# Patient Record
Sex: Female | Born: 1985 | Race: Black or African American | Hispanic: No | Smoking: Former smoker
Health system: Southern US, Community
[De-identification: ages and names within clinical notes are randomized; demographics above are authoritative.]

## PROBLEM LIST (undated history)

## (undated) DIAGNOSIS — Z789 Other specified health status: Secondary | ICD-10-CM

## (undated) HISTORY — PX: NO PAST SURGERIES: SHX2092

---

## 2009-05-03 ENCOUNTER — Emergency Department (HOSPITAL_COMMUNITY): Admission: EM | Admit: 2009-05-03 | Discharge: 2009-05-03 | Payer: Self-pay | Admitting: Emergency Medicine

## 2009-11-19 ENCOUNTER — Ambulatory Visit: Payer: Self-pay | Admitting: Diagnostic Radiology

## 2009-11-19 ENCOUNTER — Emergency Department (HOSPITAL_BASED_OUTPATIENT_CLINIC_OR_DEPARTMENT_OTHER): Admission: EM | Admit: 2009-11-19 | Discharge: 2009-11-19 | Payer: Self-pay | Admitting: Emergency Medicine

## 2010-09-30 LAB — PREGNANCY, URINE: Preg Test, Ur: NEGATIVE

## 2012-12-28 ENCOUNTER — Emergency Department (HOSPITAL_COMMUNITY)
Admission: EM | Admit: 2012-12-28 | Discharge: 2012-12-28 | Disposition: A | Discharge status: Nursing Home (Includes ICF) | Payer: BC Managed Care – PPO | Source: Home / Self Care

## 2012-12-28 ENCOUNTER — Encounter (HOSPITAL_COMMUNITY): Payer: Self-pay | Admitting: *Deleted

## 2012-12-28 ENCOUNTER — Emergency Department (HOSPITAL_COMMUNITY)
Admission: EM | Admit: 2012-12-28 | Discharge: 2012-12-29 | Disposition: A | Discharge status: Home / Self Care | Payer: BC Managed Care – PPO | Attending: Emergency Medicine | Admitting: Emergency Medicine

## 2012-12-28 ENCOUNTER — Encounter (HOSPITAL_COMMUNITY): Payer: Self-pay | Admitting: Emergency Medicine

## 2012-12-28 DIAGNOSIS — K5900 Constipation, unspecified: Secondary | ICD-10-CM | POA: Insufficient documentation

## 2012-12-28 DIAGNOSIS — F17200 Nicotine dependence, unspecified, uncomplicated: Secondary | ICD-10-CM | POA: Insufficient documentation

## 2012-12-28 DIAGNOSIS — R110 Nausea: Secondary | ICD-10-CM | POA: Insufficient documentation

## 2012-12-28 DIAGNOSIS — K219 Gastro-esophageal reflux disease without esophagitis: Secondary | ICD-10-CM

## 2012-12-28 DIAGNOSIS — Z79899 Other long term (current) drug therapy: Secondary | ICD-10-CM | POA: Insufficient documentation

## 2012-12-28 DIAGNOSIS — R1084 Generalized abdominal pain: Secondary | ICD-10-CM | POA: Insufficient documentation

## 2012-12-28 DIAGNOSIS — R109 Unspecified abdominal pain: Secondary | ICD-10-CM

## 2012-12-28 LAB — CBC WITH DIFFERENTIAL/PLATELET
Basophils Absolute: 0 10*3/uL (ref 0.0–0.1)
Eosinophils Relative: 1 % (ref 0–5)
Hemoglobin: 13.4 g/dL (ref 12.0–15.0)
MCHC: 33.5 g/dL (ref 30.0–36.0)
MCV: 84.2 fL (ref 78.0–100.0)
Monocytes Absolute: 0.9 10*3/uL (ref 0.1–1.0)
Neutrophils Relative %: 70 % (ref 43–77)
Platelets: 371 10*3/uL (ref 150–400)
RBC: 4.75 MIL/uL (ref 3.87–5.11)
WBC: 12.5 10*3/uL — ABNORMAL HIGH (ref 4.0–10.5)

## 2012-12-28 LAB — POCT I-STAT, CHEM 8
BUN: 10 mg/dL (ref 6–23)
Calcium, Ion: 1.25 mmol/L — ABNORMAL HIGH (ref 1.12–1.23)
Chloride: 105 mEq/L (ref 96–112)
HCT: 46 % (ref 36.0–46.0)
Hemoglobin: 15.6 g/dL — ABNORMAL HIGH (ref 12.0–15.0)
Potassium: 4 mEq/L (ref 3.5–5.1)
Sodium: 143 mEq/L (ref 135–145)

## 2012-12-28 LAB — COMPREHENSIVE METABOLIC PANEL
BUN: 11 mg/dL (ref 6–23)
CO2: 26 mEq/L (ref 19–32)
Creatinine, Ser: 0.81 mg/dL (ref 0.50–1.10)
GFR calc non Af Amer: >90 mL/min (ref >90)
Potassium: 4.6 mEq/L (ref 3.5–5.1)
Sodium: 139 mEq/L (ref 135–145)

## 2012-12-28 LAB — POCT URINALYSIS DIP (DEVICE)
Ketones, ur: NEGATIVE mg/dL
Leukocytes, UA: NEGATIVE
Protein, ur: NEGATIVE mg/dL
Specific Gravity, Urine: 1.025 (ref 1.005–1.030)

## 2012-12-28 LAB — POCT H PYLORI SCREEN: H. PYLORI SCREEN, POC: NEGATIVE

## 2012-12-28 MED ORDER — IOHEXOL 300 MG/ML  SOLN
20.0000 mL | INTRAMUSCULAR | Status: DC
  Administered 2012-12-28: 50 mL via ORAL

## 2012-12-28 MED ORDER — ONDANSETRON HCL 4 MG/2ML IJ SOLN
4.0000 mg | Freq: Once | INTRAMUSCULAR | Status: AC
  Administered 2012-12-28: 4 mg via INTRAVENOUS
  Filled 2012-12-28: qty 2

## 2012-12-28 MED ORDER — PANTOPRAZOLE SODIUM 40 MG PO TBEC: 40.0000 mg | DELAYED_RELEASE_TABLET | Freq: Every day | ORAL | Status: DC

## 2012-12-28 MED ORDER — GI COCKTAIL ~~LOC~~
30.0000 mL | Freq: Once | ORAL | Status: AC
  Administered 2012-12-28: 30 mL via ORAL
  Filled 2012-12-28: qty 30

## 2012-12-28 MED ORDER — MORPHINE SULFATE 4 MG/ML IJ SOLN
4.0000 mg | Freq: Once | INTRAMUSCULAR | Status: AC
  Administered 2012-12-28: 4 mg via INTRAVENOUS
  Filled 2012-12-28: qty 1

## 2012-12-28 NOTE — ED Provider Notes (Signed)
History    CSN: [id number] Arrival date & time 12/28/12  1944  First MD Initiated Contact with Patient 12/28/12 2205     Chief Complaint  Patient presents with  . Abdominal Pain   (Consider location/radiation/quality/duration/timing/severity/associated sxs/prior Treatment) HPI Comments: Pt w/ no PMHx now w/ 3 wk hx of abd pain and GERD. States daily intermittent diffuse abd pain - sharp, a/w nausea, no vomiting. Denies diarrhea/constipation, dysuria/polyuria/hematuria. LMP 2 months ago, no vaginal bleeding/discharge/irritation. Pain not exacerbated w/ PO intake. Also admits to GERD - substernal burning pain worse in AM. A/w PO intake and position changes. No fever, sick contact, travel or hospitalizations. Seen at urgent care today, hcg neg, u/a neg, mild leukocytosis - sent to ED for further eval.   Patient is a 27 y.o. female presenting with general illness. The history is provided by the patient. No language interpreter was used.  Illness Location:  GI Quality:  Abd pain Severity:  Mild Onset quality:  Gradual Duration:  3 weeks Timing:  Intermittent Progression:  Worsening Chronicity:  Recurrent Associated symptoms: abdominal pain and nausea   Associated symptoms: no chest pain, no congestion, no cough, no diarrhea, no fever, no headaches, no rash, no shortness of breath, no sore throat and no vomiting    No past medical history on file. No past surgical history on file. No family history on file. History  Substance Use Topics  . Smoking status: Current Every Day Smoker  . Smokeless tobacco: Not on file  . Alcohol Use: Yes   OB History   Grav Para Term Preterm Abortions TAB SAB Ect Mult Living                 Review of Systems  Constitutional: Negative for fever and chills.  HENT: Negative for congestion and sore throat.   Respiratory: Negative for cough and shortness of breath.   Cardiovascular: Negative for chest pain and leg swelling.  Gastrointestinal: Positive  for nausea and abdominal pain. Negative for vomiting, diarrhea and constipation.  Genitourinary: Negative for dysuria and frequency.  Skin: Negative for color change and rash.  Neurological: Negative for dizziness and headaches.  Psychiatric/Behavioral: Negative for confusion and agitation.  All other systems reviewed and are negative.    Allergies  Review of patient's allergies indicates no known allergies.  Home Medications   Current Outpatient Rx  Name  Route  Sig  Dispense  Refill  . drospirenone-ethinyl estradiol (YAZ,GIANVI,LORYNA) 3-0.02 MG tablet   Oral   Take 1 tablet by mouth daily.         . pantoprazole (PROTONIX) 40 MG tablet   Oral   Take 1 tablet (40 mg total) by mouth daily.   30 tablet   0    BP 120/59  Pulse 66  Temp(Src) 98.3 F (36.8 C) (Oral)  Resp 16  SpO2 100%  LMP 10/31/2012 Physical Exam  Vitals reviewed. Constitutional: She is oriented to person, place, and time. She appears well-developed and well-nourished. No distress.  HENT:  Head: Normocephalic and atraumatic.  Eyes: EOM are normal. Pupils are equal, round, and reactive to light.  Neck: Normal range of motion. Neck supple.  Cardiovascular: Normal rate and regular rhythm.   Pulmonary/Chest: Effort normal. No respiratory distress.  Abdominal: Soft. She exhibits no distension. There is no hepatosplenomegaly. There is tenderness in the left lower quadrant. There is rebound. There is no rigidity, no guarding, no CVA tenderness, no tenderness at McBurney's point and negative Murphy's sign.  Musculoskeletal: Normal range of motion. She exhibits no edema.  Neurological: She is alert and oriented to person, place, and time.  Skin: Skin is warm and dry.  Psychiatric: She has a normal mood and affect. Her behavior is normal.    ED Course  Procedures (including critical care time) Labs Reviewed  LIPASE, BLOOD  COMPREHENSIVE METABOLIC PANEL   Results for orders placed during the hospital  encounter of 12/28/12  LIPASE, BLOOD      Result Value Range   Lipase 42  11 - 59 U/L  COMPREHENSIVE METABOLIC PANEL      Result Value Range   Sodium 139  135 - 145 mEq/L   Potassium 4.6  3.5 - 5.1 mEq/L   Chloride 105  96 - 112 mEq/L   CO2 26  19 - 32 mEq/L   Glucose, Bld 101 (*) 70 - 99 mg/dL   BUN 11  6 - 23 mg/dL   Creatinine, Ser 1.61  0.50 - 1.10 mg/dL   Calcium 9.5  8.4 - 09.6 mg/dL   Total Protein 8.1  6.0 - 8.3 g/dL   Albumin 3.9  3.5 - 5.2 g/dL   AST 17  0 - 37 U/L   ALT 15  0 - 35 U/L   Alkaline Phosphatase 80  39 - 117 U/L   Total Bilirubin 0.2 (*) 0.3 - 1.2 mg/dL   GFR calc non Af Amer >90  >90 mL/min   GFR calc Af Amer >90  >90 mL/min    CT Abdomen Pelvis W Contrast (Final result)  Result time: 12/29/12 01:33:26    Final result by Rad Results In Interface (12/29/12 01:33:26)    Narrative:   *RADIOLOGY REPORT*  Clinical Data: Abdominal pain.  CT ABDOMEN AND PELVIS WITH CONTRAST  Technique: Multidetector CT imaging of the abdomen and pelvis was performed following the standard protocol during bolus administration of intravenous contrast.  Contrast: OMNIPAQUE IOHEXOL 300 MG/ML SOLN  Comparison: None.  Findings: Lung Bases: Clear. Respiratory motion at the superior margin.  Liver: Normal.  Spleen: Normal.  Gallbladder: Normal.  Common bile duct: Normal.  Pancreas: Normal.  Adrenal glands: Normal.  Kidneys: Distended with contrast. No inflammatory changes.  Stomach: Normal.  Small bowel: No obstruction or inflammatory changes. No mesenteric adenopathy. Fecalization of loops in the anatomic pelvis leading up to the ileocecal valve without an obstruction point.  Colon: Normal appendix. Prominent stool burden. No inflammatory changes. Rectosigmoid appears within normal limits.  Pelvic Genitourinary: Fibroid uterus. Urinary bladder decompressed. No free fluid. Physiologic appearance of the ovaries.  Bones: No aggressive osseous  lesions.  Vasculature: Normal.  Body Wall: Normal.  IMPRESSION: No acute abnormality. Fecalization of distal small bowel without obstruction suggesting obstipation in the appropriate setting.   Original Report Authenticated By: Andreas Newport, M.D.         No results found. No diagnosis found.  MDM  Exam significant for LLQ pain w/ rebound. Likely benign, possible diverticulitis, colitis. Doubt ovarian cyst or torsion or TOA. Doubt appendicitis or perforated viscus. H. pylori at UC neg. Likely GERD, possible gastritis. Will obtain CT, lipase and and CMP, will give GI cocktail, morphine and zofran.   Course: vitals stable, NAD, c/w c/w constipation - no obstruction. Labs unremarkable, lipase normal. Stable for d/c home. Given rx for miralax and colace. Encourage increased fiber and hydration. Given rx for protonix. fup w/ pcp as needed. D/c in good condition. Given return precautions.   1. Abdominal  pain, other specified  site   2. GERD (gastroesophageal reflux disease)   3. Constipation    New Prescriptions   DOCUSATE SODIUM (COLACE) 100 MG CAPSULE    Take 1 capsule (100 mg total) by mouth every 12 (twelve) hours.   PANTOPRAZOLE (PROTONIX) 20 MG TABLET    Take 1 tablet (20 mg total) by mouth daily.   POLYETHYLENE GLYCOL (MIRALAX / GLYCOLAX) PACKET    Take 17 g by mouth daily.      follow up with your primary care as needed    Audelia Hives, MD 12/29/12 [phone]

## 2012-12-28 NOTE — ED Provider Notes (Signed)
27 year old female was sent here from urgent care to get a CT scan. She's been having upper abdominal complaints which seem consistent with GERD and that has been going on for several weeks. Over the last several days, she has had pain across both lower quadrants without radiation. Pain is moderate and she rates it at 6/10. She's not had any constipation or diarrhea and has not had any fever or chills. She denies any urinary symptoms. She went to urgent care were WBC was noted to be elevated to 12.5 and she was sent here to get CT scan. On exam, she has significant left lower quadrant tenderness with rebound tenderness. There is no guarding. Bowel sounds are present. CT scan has been ordered.  I saw and evaluated the patient, reviewed the resident's note and I agree with the findings and plan.   Dione Booze, MD 12/28/12 2257

## 2012-12-28 NOTE — ED Notes (Signed)
Lower abd. Pain: 2-3 weeks. Nausea, h/a, fatigue.  Also, c/o acid reflux. At urgent care, WBC elevated to 12.5. No urinary problems.

## 2012-12-28 NOTE — ED Notes (Signed)
Pt reports intermittent right and lower abdominal pain, cramping type in nature associated with nausea x approx 2 weeks. Report no menstrual cycle in 1 month. Pt reports intermittent generalized headache and just not feeling right.

## 2012-12-28 NOTE — ED Notes (Signed)
Pt c/o poss preg... LMP on 10/31/12... 3 home preg tests were all neg.. sxs today include lower abd pain, HA, fatigue... Denies: f/v/d, urinary sxs, gyn sxs... Also c/o acid reflux... Taking tums w/no relief... She is alert w/no signs of acute distress.

## 2012-12-29 ENCOUNTER — Emergency Department (HOSPITAL_COMMUNITY): Payer: BC Managed Care – PPO

## 2012-12-29 MED ORDER — IOHEXOL 300 MG/ML  SOLN
100.0000 mL | Freq: Once | INTRAMUSCULAR | Status: AC | PRN
  Administered 2012-12-29: 100 mL via INTRAVENOUS

## 2012-12-29 MED ORDER — POLYETHYLENE GLYCOL 3350 17 G PO PACK: 17.0000 g | PACK | Freq: Every day | ORAL | Status: DC

## 2012-12-29 MED ORDER — DOCUSATE SODIUM 100 MG PO CAPS: 100.0000 mg | ORAL_CAPSULE | Freq: Two times a day (BID) | ORAL | Status: DC

## 2012-12-29 MED ORDER — PANTOPRAZOLE SODIUM 20 MG PO TBEC: 20.0000 mg | DELAYED_RELEASE_TABLET | Freq: Every day | ORAL | Status: DC

## 2012-12-29 NOTE — ED Provider Notes (Signed)
History    CSN: [id number] Arrival date & time 12/28/12  1602  None    Chief Complaint  Patient presents with  . Possible Pregnancy   (Consider location/radiation/quality/duration/timing/severity/associated sxs/prior Treatment) HPI  27 yo bf presented with acid reflux, abd pain, fatigue.  Symptoms onging for several days.  Has taken otc antacid for reflux without relief.  Reflux symptoms with lying down and after eating.  Denies dysuria, hematuria , diarrhea.  Some constipation.   History reviewed. No pertinent past medical history. History reviewed. No pertinent past surgical history. No family history on file. History  Substance Use Topics  . Smoking status: Current Every Day Smoker  . Smokeless tobacco: Not on file  . Alcohol Use: Yes   OB History   Grav Para Term Preterm Abortions TAB SAB Ect Mult Living                 Review of Systems  Constitutional: Negative for fever, chills, diaphoresis and fatigue.  Eyes: Negative.   Respiratory: Negative.   Cardiovascular: Negative.   Gastrointestinal: Positive for abdominal pain and constipation. Negative for nausea, vomiting and rectal pain.  Endocrine: Negative.   Genitourinary: Negative for dysuria, hematuria, flank pain, vaginal discharge and difficulty urinating.  Musculoskeletal: Negative.   Skin: Negative.   Psychiatric/Behavioral: Negative.     Allergies  Review of patient's allergies indicates no known allergies.  Home Medications   Current Outpatient Rx  Name  Route  Sig  Dispense  Refill  . drospirenone-ethinyl estradiol (YAZ,GIANVI,LORYNA) 3-0.02 MG tablet   Oral   Take 1 tablet by mouth daily.         Marland Kitchen docusate sodium (COLACE) 100 MG capsule   Oral   Take 1 capsule (100 mg total) by mouth every 12 (twelve) hours.   60 capsule   0   . pantoprazole (PROTONIX) 20 MG tablet   Oral   Take 1 tablet (20 mg total) by mouth daily.   60 tablet   0   . pantoprazole (PROTONIX) 40 MG tablet   Oral  Take 1 tablet (40 mg total) by mouth daily.   30 tablet   0   . polyethylene glycol (MIRALAX / GLYCOLAX) packet   Oral   Take 17 g by mouth daily.   14 each   0    BP 106/71  Pulse 66  Temp(Src) 97.3 F (36.3 C) (Oral)  Resp 16  SpO2 100%  LMP 10/31/2012 Physical Exam  Constitutional: She is oriented to person, place, and time. She appears well-developed and well-nourished.  HENT:  Head: Normocephalic and atraumatic.  Eyes: EOM are normal. Pupils are equal, round, and reactive to light.  Neck: Normal range of motion.  Cardiovascular: Normal rate and regular rhythm.   Pulmonary/Chest: Effort normal and breath sounds normal.  Abdominal: Soft. Bowel sounds are normal. There is tenderness. There is guarding.  Musculoskeletal: Normal range of motion.  Neurological: She is alert and oriented to person, place, and time.  Skin: Skin is warm and dry.  Psychiatric: She has a normal mood and affect.    ED Course  Procedures (including critical care time) Labs Reviewed  CBC WITH DIFFERENTIAL - Abnormal; Notable for the following:    WBC 12.5 (*)    Neutro Abs 8.7 (*)    All other components within normal limits  POCT I-STAT, CHEM 8 - Abnormal; Notable for the following:    Calcium, Ion 1.25 (*)    Hemoglobin 15.6 (*)  All other components within normal limits  POCT PREGNANCY, URINE  POCT URINALYSIS DIP (DEVICE)  POCT H PYLORI SCREEN   Ct Abdomen Pelvis W Contrast  12/29/2012   *RADIOLOGY REPORT*  Clinical Data: Abdominal pain.  CT ABDOMEN AND PELVIS WITH CONTRAST  Technique:  Multidetector CT imaging of the abdomen and pelvis was performed following the standard protocol during bolus administration of intravenous contrast.  Contrast: OMNIPAQUE IOHEXOL 300 MG/ML  SOLN  Comparison: None.  Findings: Lung Bases: Clear.  Respiratory motion at the superior margin.  Liver:  Normal.  Spleen:  Normal.  Gallbladder:  Normal.  Common bile duct:  Normal.  Pancreas:  Normal.  Adrenal  glands:  Normal.  Kidneys:  Distended with contrast.  No inflammatory changes.  Stomach:  Normal.  Small bowel:  No obstruction or inflammatory changes.  No mesenteric adenopathy.  Fecalization of loops in the anatomic pelvis leading up to the ileocecal valve without an obstruction point.  Colon:   Normal appendix.  Prominent stool burden.  No inflammatory changes.  Rectosigmoid appears within normal limits.  Pelvic Genitourinary:  Fibroid uterus.  Urinary bladder decompressed.  No free fluid.  Physiologic appearance of the ovaries.  Bones:  No aggressive osseous lesions.  Vasculature: Normal.  Body Wall: Normal.  IMPRESSION: No acute abnormality.  Fecalization of distal small bowel without obstruction suggesting obstipation in the appropriate setting.   Original Report Authenticated By: Andreas Newport, M.D.   1. GERD (gastroesophageal reflux disease)     MDM  With her worsening symptoms and increased wbc, we will send her down to ED for furter evaluation.  Patient voices understanding.   Zonia Kief, PA-C 12/29/12 2040

## 2012-12-29 NOTE — ED Notes (Signed)
Pt tolerated drinking contrast with no difficulty. CT called.

## 2012-12-30 NOTE — ED Provider Notes (Signed)
Medical screening examination/treatment/procedure(s) were performed by non-physician practitioner and as supervising physician I was immediately available for consultation/collaboration.  Leslee Home, M.D.  Reuben Likes, MD 12/30/12 [phone]

## 2014-09-21 ENCOUNTER — Emergency Department (HOSPITAL_COMMUNITY)
Admission: EM | Admit: 2014-09-21 | Discharge: 2014-09-21 | Disposition: A | Discharge status: Home / Self Care | Payer: 59 | Source: Home / Self Care | Attending: Family Medicine | Admitting: Family Medicine

## 2014-09-21 ENCOUNTER — Emergency Department (INDEPENDENT_AMBULATORY_CARE_PROVIDER_SITE_OTHER): Payer: 59

## 2014-09-21 ENCOUNTER — Encounter (HOSPITAL_COMMUNITY): Payer: Self-pay | Admitting: *Deleted

## 2014-09-21 DIAGNOSIS — R0789 Other chest pain: Secondary | ICD-10-CM

## 2014-09-21 MED ORDER — DICLOFENAC POTASSIUM 50 MG PO TABS: 50.0000 mg | ORAL_TABLET | Freq: Three times a day (TID) | ORAL | Status: DC

## 2014-09-21 NOTE — ED Notes (Signed)
Per pt request RX called to [facility] on [address].  # [id number].  Rx diclofenac 50mg  1 tab 3 x daily for chest pain #21 with no refills.  Mw,cma

## 2014-09-21 NOTE — ED Provider Notes (Signed)
CSN: [id number]     Arrival date & time 09/21/14  0900 History   First MD Initiated Contact with Patient 09/21/14 [phone]     Chief Complaint  Patient presents with  . Chest Pain   (Consider location/radiation/quality/duration/timing/severity/associated sxs/prior Treatment) Patient is a 29 y.o. female presenting with chest pain. The history is provided by the patient.  Chest Pain Pain location:  L lateral chest Pain quality: sharp   Pain radiates to:  Does not radiate Pain radiates to the back: no   Pain severity:  Mild Onset quality:  Gradual Duration:  3 days Chronicity:  New Context: movement   Relieved by:  None tried Worsened by:  Nothing tried Ineffective treatments:  None tried Associated symptoms: no cough, no fever, no lower extremity edema, no nausea, no palpitations and no shortness of breath     History reviewed. No pertinent past medical history. History reviewed. No pertinent past surgical history. No family history on file. History  Substance Use Topics  . Smoking status: Current Every Day Smoker  . Smokeless tobacco: Not on file  . Alcohol Use: Yes   OB History    No data available     Review of Systems  Constitutional: Negative.  Negative for fever.  Respiratory: Negative for cough and shortness of breath.   Cardiovascular: Positive for chest pain. Negative for palpitations.  Gastrointestinal: Negative for nausea.    Allergies  Review of patient's allergies indicates no known allergies.  Home Medications   Prior to Admission medications   Medication Sig Start Date End Date Taking? Authorizing Provider  diclofenac (CATAFLAM) 50 MG tablet Take 1 tablet (50 mg total) by mouth 3 (three) times daily. For chest pain 09/21/14   Billy Fischer, MD  docusate sodium (COLACE) 100 MG capsule Take 1 capsule (100 mg total) by mouth every 12 (twelve) hours. 12/29/12   Ernst Spell, MD  drospirenone-ethinyl estradiol Sherrill Raring) 3-0.02 MG tablet Take 1  tablet by mouth daily.    Historical Provider, MD  pantoprazole (PROTONIX) 20 MG tablet Take 1 tablet (20 mg total) by mouth daily. 12/29/12   Ernst Spell, MD  pantoprazole (PROTONIX) 40 MG tablet Take 1 tablet (40 mg total) by mouth daily. 12/28/12   Lanae Crumbly, PA-C  polyethylene glycol [facility] / GLYCOLAX) packet Take 17 g by mouth daily. 12/29/12   Ernst Spell, MD   BP 120/62 mmHg  Pulse 74  Temp(Src) 98.7 F (37.1 C) (Oral)  Resp 12  SpO2 100%  LMP 09/08/2014 Physical Exam  Constitutional: She appears well-developed and well-nourished. No distress.  HENT:  Mouth/Throat: Oropharynx is clear and moist.  Eyes: Pupils are equal, round, and reactive to light.  Neck: Normal range of motion. Neck supple.  Cardiovascular: Normal rate, regular rhythm, normal heart sounds and intact distal pulses.   Pulmonary/Chest: Effort normal and breath sounds normal. She exhibits no tenderness.  Abdominal: Soft. Bowel sounds are normal. There is no tenderness.  Skin: Skin is warm and dry.  Nursing note and vitals reviewed.   ED Course  Procedures (including critical care time) Labs Review Labs Reviewed - No data to display  Imaging Review Dg Chest 2 View  09/21/2014   CLINICAL DATA:  Three-day history of left-sided chest pain  EXAM: CHEST  2 VIEW  COMPARISON:  Nov 19, 2009  FINDINGS: Lungs are clear. The heart size and pulmonary vascularity are normal. No pneumothorax. No adenopathy. No bone lesions.  IMPRESSION: No abnormality noted.   Electronically Signed  By: Lowella Grip III M.D.   On: 09/21/2014 09:51   X-rays reviewed and report per radiologist.    Pauline Good MD.    MDM      Billy Fischer, MD 09/21/14 1014

## 2014-09-21 NOTE — Discharge Instructions (Signed)
Use medicine and heat as needed, activity as tolerated, return as needed.

## 2014-09-21 NOTE — ED Notes (Signed)
Pt  Reports  l upper sided    Chest  Pain  Radiating  Around  To her l  Shoulder        -  Hurts   When  She  Takes  A  Deep breath   Or  Laughs      -    She   sdenys  Any  specefic  Injury  She  Is sitting  Upright on  The  Exam table  Speaking in  Complete  sentances

## 2015-04-21 IMAGING — CT CT ABD-PELV W/ CM
2 of 4 series · 16 of 46 positions shown, 18 images · IV contrast (APPLIED)
Comparison: None.

CLINICAL DATA: Abdominal pain.

CT ABDOMEN AND PELVIS WITH CONTRAST
TECHNIQUE: Multidetector CT imaging of the abdomen and pelvis was
performed following the standard protocol during bolus
administration of intravenous contrast.
Contrast: 100mL OMNIPAQUE IOHEXOL 300 MG/ML  SOLN

[Series 2: abd/ pelvis 5.0 i30f 1 · axial · 0.79mm/px · z∈[+815,+1255]mm · 13 of 98 slices shown, 15 images]
[im 5/98  soft-tissue]
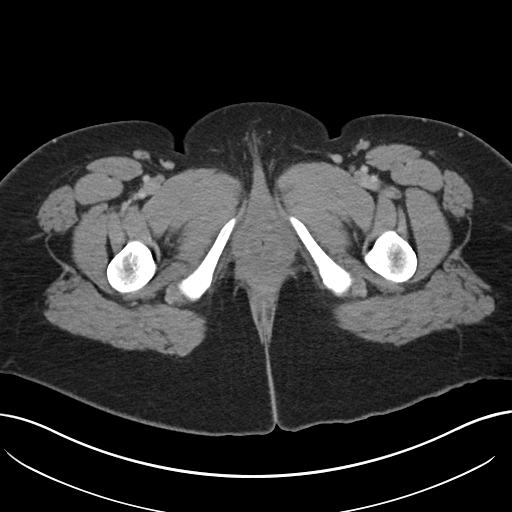
[im 5/98  bone]
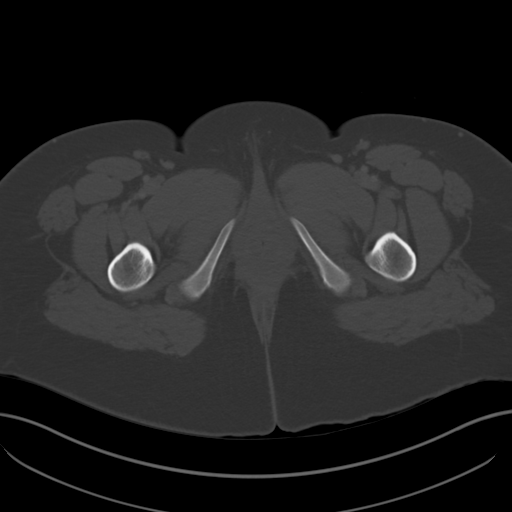
[im 13/98  soft-tissue]
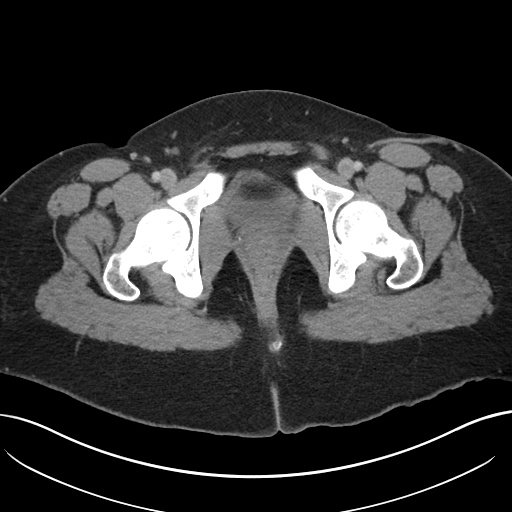
[im 21/98  soft-tissue]
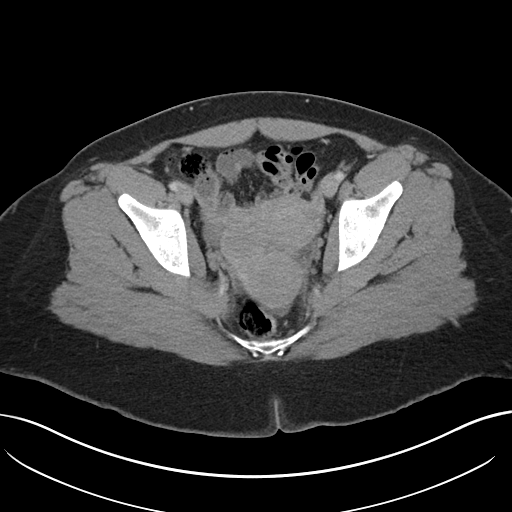
[im 29/98  soft-tissue]
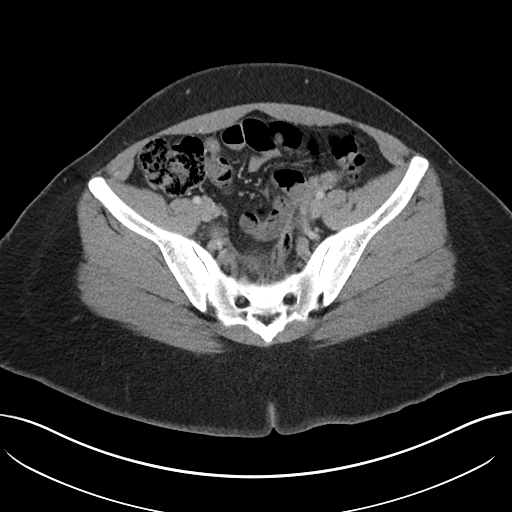
[im 33/98  soft-tissue]
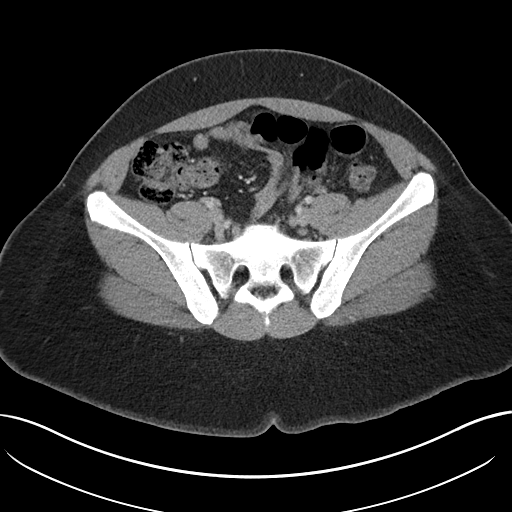
[im 41/98  soft-tissue]
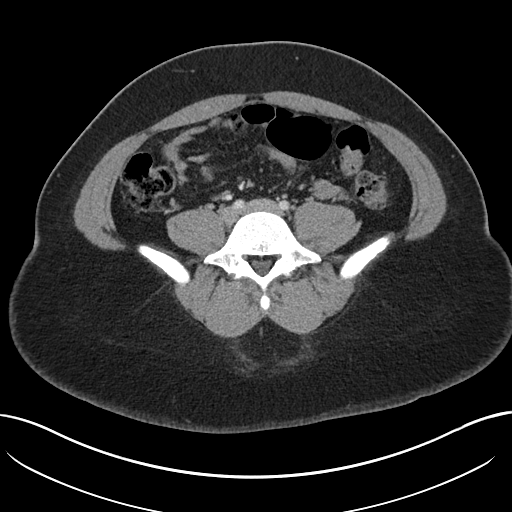
[im 49/98  soft-tissue]
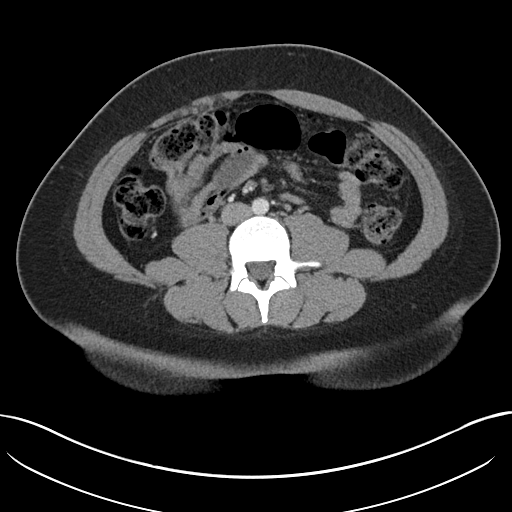
[im 57/98  soft-tissue]
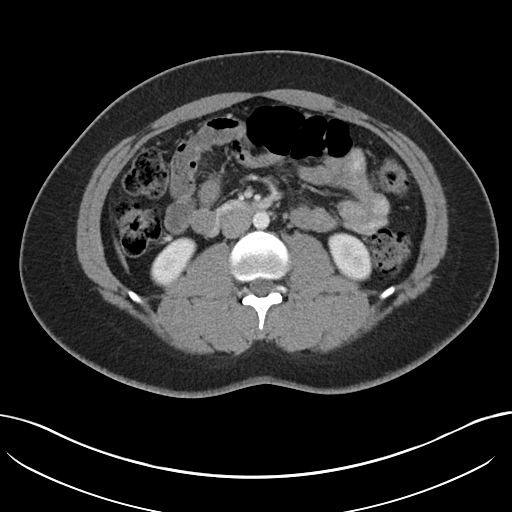
[im 65/98  soft-tissue]
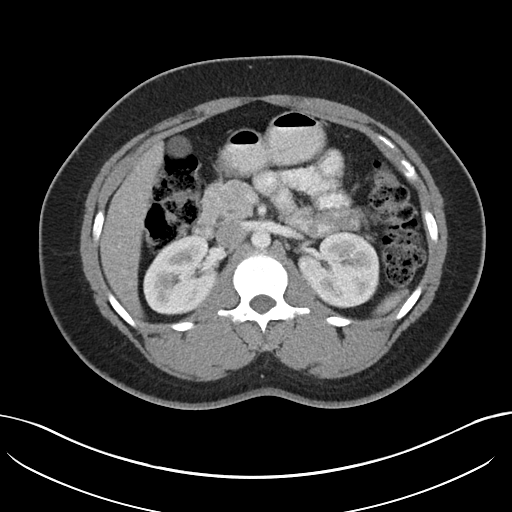
[im 65/98  bone]
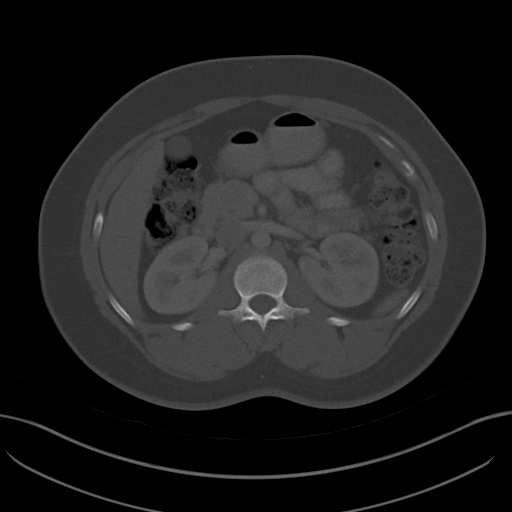
[im 69/98  soft-tissue]
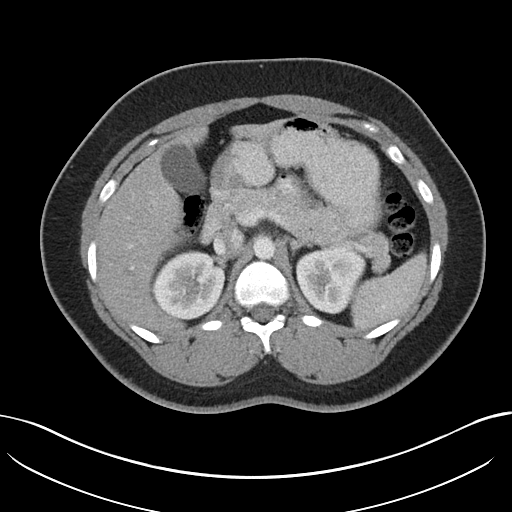
[im 77/98  soft-tissue]
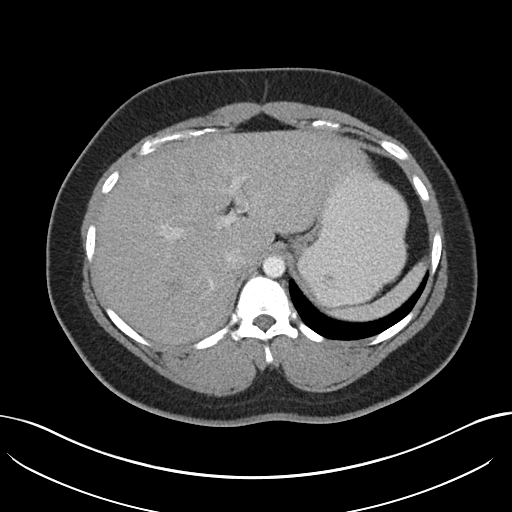
[im 85/98  soft-tissue]
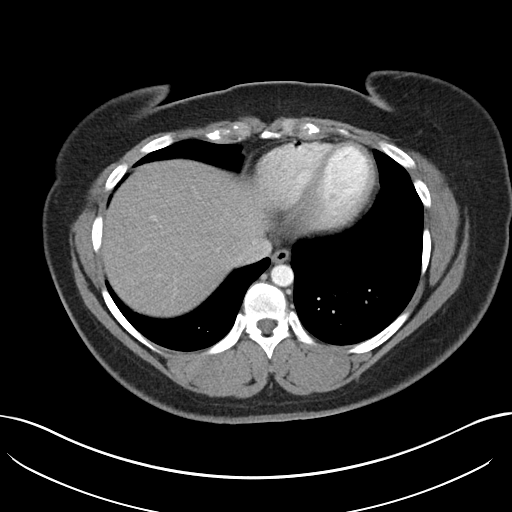
[im 93/98  soft-tissue]
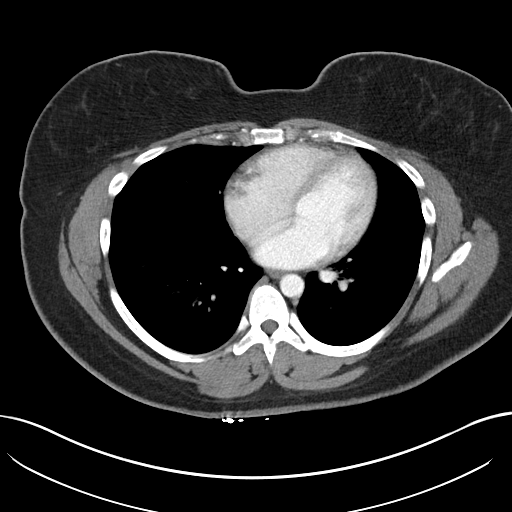

[Series 5: cororal soft tissue · coronal · 0.95mm/px · 3 of 95 slices shown]
[im 32/95  soft-tissue]
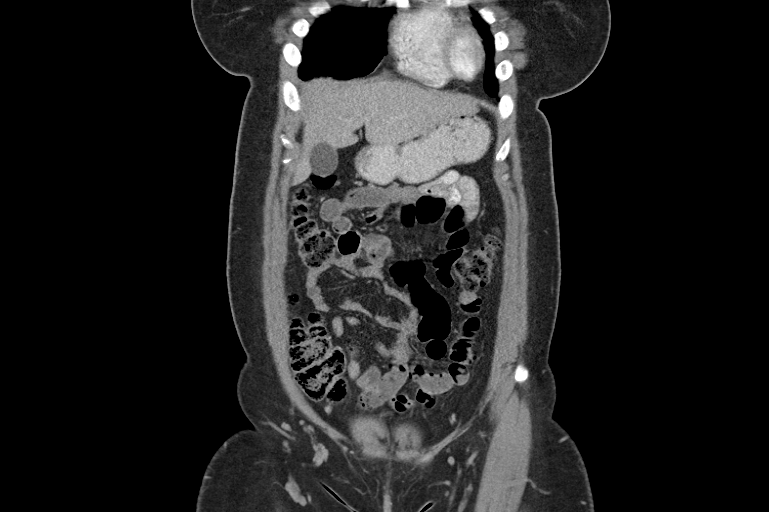
[im 42/95  soft-tissue]
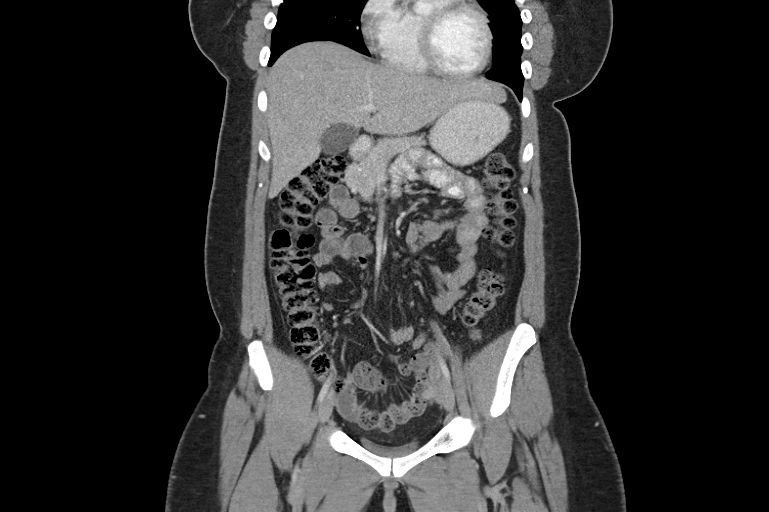
[im 53/95  soft-tissue]
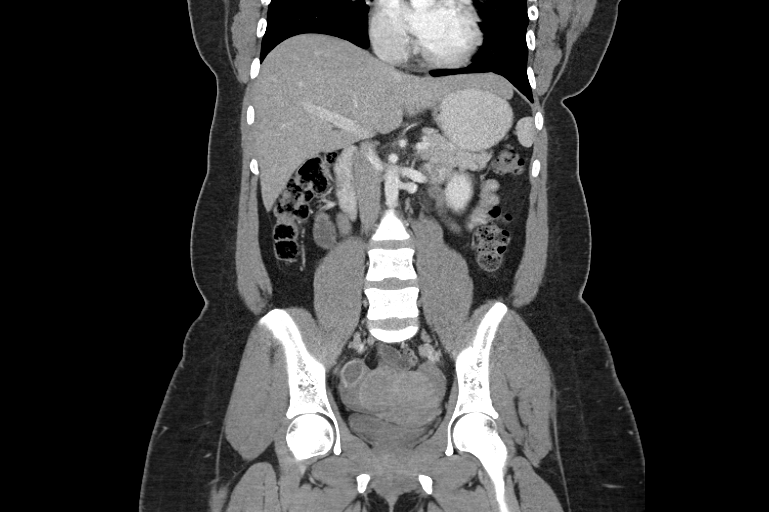

[16 of 46 positions shown; findings below may reference images not displayed]

FINDINGS: Lung Bases: Clear.  Respiratory motion at the superior
margin.

Liver:  Normal.

Spleen:  Normal.

Gallbladder:  Normal.

Common bile duct:  Normal.

Pancreas:  Normal.

Adrenal glands:  Normal.

Kidneys:  Distended with contrast.  No inflammatory changes.

Stomach:  Normal.

Small bowel:  No obstruction or inflammatory changes.  No
mesenteric adenopathy.  Fecalization of loops in the anatomic
pelvis leading up to the ileocecal valve without an obstruction
point.

Colon:   Normal appendix.  Prominent stool burden.  No inflammatory
changes.  Rectosigmoid appears within normal limits.

Pelvic Genitourinary:  Fibroid uterus.  Urinary bladder
decompressed.  No free fluid.  Physiologic appearance of the
ovaries.

Bones:  No aggressive osseous lesions.

Vasculature: Normal.

Body Wall: Normal.
IMPRESSION: No acute abnormality.  Fecalization of distal small bowel without
obstruction suggesting obstipation in the appropriate setting.

## 2020-11-17 ENCOUNTER — Encounter (HOSPITAL_COMMUNITY): Payer: Self-pay | Admitting: Obstetrics & Gynecology

## 2020-11-17 ENCOUNTER — Inpatient Hospital Stay (HOSPITAL_COMMUNITY)
Admission: AD | Admit: 2020-11-17 | Discharge: 2020-11-17 | Disposition: A | Discharge status: Home / Self Care | Payer: 59 | Attending: Obstetrics & Gynecology | Admitting: Obstetrics & Gynecology

## 2020-11-17 ENCOUNTER — Other Ambulatory Visit: Payer: Self-pay

## 2020-11-17 DIAGNOSIS — N898 Other specified noninflammatory disorders of vagina: Secondary | ICD-10-CM

## 2020-11-17 DIAGNOSIS — O4692 Antepartum hemorrhage, unspecified, second trimester: Secondary | ICD-10-CM | POA: Insufficient documentation

## 2020-11-17 DIAGNOSIS — O0932 Supervision of pregnancy with insufficient antenatal care, second trimester: Secondary | ICD-10-CM | POA: Insufficient documentation

## 2020-11-17 DIAGNOSIS — Z3A26 26 weeks gestation of pregnancy: Secondary | ICD-10-CM | POA: Insufficient documentation

## 2020-11-17 DIAGNOSIS — R1011 Right upper quadrant pain: Secondary | ICD-10-CM | POA: Diagnosis not present

## 2020-11-17 DIAGNOSIS — O26892 Other specified pregnancy related conditions, second trimester: Secondary | ICD-10-CM

## 2020-11-17 DIAGNOSIS — R1031 Right lower quadrant pain: Secondary | ICD-10-CM | POA: Diagnosis not present

## 2020-11-17 DIAGNOSIS — F17200 Nicotine dependence, unspecified, uncomplicated: Secondary | ICD-10-CM | POA: Insufficient documentation

## 2020-11-17 DIAGNOSIS — Z3689 Encounter for other specified antenatal screening: Secondary | ICD-10-CM

## 2020-11-17 DIAGNOSIS — O99332 Smoking (tobacco) complicating pregnancy, second trimester: Secondary | ICD-10-CM | POA: Insufficient documentation

## 2020-11-17 DIAGNOSIS — Z3A27 27 weeks gestation of pregnancy: Secondary | ICD-10-CM

## 2020-11-17 DIAGNOSIS — Z3402 Encounter for supervision of normal first pregnancy, second trimester: Secondary | ICD-10-CM

## 2020-11-17 HISTORY — DX: Other specified health status: Z78.9

## 2020-11-17 LAB — URINALYSIS, ROUTINE W REFLEX MICROSCOPIC
Bacteria, UA: NONE SEEN
Bilirubin Urine: NEGATIVE
Glucose, UA: NEGATIVE mg/dL
Hgb urine dipstick: NEGATIVE
Ketones, ur: 5 mg/dL — AB
Nitrite: NEGATIVE
Protein, ur: 30 mg/dL — AB
Specific Gravity, Urine: 1.031 — ABNORMAL HIGH (ref 1.005–1.030)
pH: 5 (ref 5.0–8.0)

## 2020-11-17 LAB — WET PREP, GENITAL
Clue Cells Wet Prep HPF POC: NONE SEEN
Sperm: NONE SEEN
Trich, Wet Prep: NONE SEEN
Yeast Wet Prep HPF POC: NONE SEEN

## 2020-11-17 MED ORDER — PREPLUS 27-1 MG PO TABS: 1.0000 | ORAL_TABLET | Freq: Every day | ORAL | 13 refills | Status: DC

## 2020-11-17 NOTE — MAU Provider Note (Addendum)
History     CSN: [id number]  Arrival date and time: 11/17/20 1217   Event Date/Time   First Provider Initiated Contact with Patient 11/17/20 1322      Chief Complaint  Patient presents with  . Vaginal Bleeding  . Abdominal Pain   Stacey Ochoa is a 35 year old G1P0 female at approx 26 weeks, 5 days presenting with light vaginal bleeding and mild abdominal pain. She took a pregnancy test that was positive at [facility] on 10/29/20. [place] was done by [facility] and estimated [redacted] weeks gestation based on [place]. Gender was unable to be determined.     Vaginal Bleeding The patient's primary symptoms include vaginal bleeding. This is a new problem. The current episode started today. The problem has been gradually improving. She is pregnant. Associated symptoms include abdominal pain. The vaginal discharge was brown, dark and mucoid. The vaginal bleeding is spotting. She has not been passing clots. She has not been passing tissue. Nothing aggravates the symptoms. She has tried nothing for the symptoms. Her sexual activity is non-contributory to the current illness.  Abdominal Pain This is a new problem. The current episode started in the past 7 days. The problem occurs daily. The problem has been unchanged. The pain is located in the generalized abdominal region, RUQ and RLQ. The pain is mild. The quality of the pain is dull, aching and cramping. The abdominal pain does not radiate. The pain is aggravated by certain positions. She has tried nothing for the symptoms.   OB History    Gravida  1   Para      Term      Preterm      AB      Living        SAB      IAB      Ectopic      Multiple      Live Births             Past Medical History:  Diagnosis Date  . Medical history non-contributory     Past Surgical History:  Procedure Laterality Date  . NO PAST SURGERIES      History reviewed. No pertinent family history.  Social History   Tobacco Use  .  Smoking status: Current Every Day Smoker  . Smokeless tobacco: Never Used  Substance Use Topics  . Alcohol use: Yes  . 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   Allergies: No Known Allergies  Review of Systems  Gastrointestinal: Positive for abdominal pain.  Genitourinary: Positive for vaginal bleeding.   Physical Exam   Blood pressure 119/76, pulse 98, temperature 98.3 F (36.8 C), temperature source Oral, resp. rate 18, height 5' 6.5" (1.689 m), weight 104.7 kg, SpO2 99 %.  Physical Exam Exam conducted with a chaperone present.  Constitutional:      Appearance: She is well-developed.  Pulmonary:     Effort: Pulmonary effort is normal.  Abdominal:     Palpations: Abdomen is soft.     Comments: Fetal head palpated at RUQ.   Genitourinary:    Vagina: Normal.     Cervix: Normal.     Comments: Cervix closed. Minimal amount of dark blood present. Skin:    General: Skin is warm and dry.  Neurological:     Mental Status: She is alert.  Psychiatric:        Speech: Speech normal.        Behavior:  Behavior normal.    MAU Course  Procedures None  Assessment and Plan  Stacey Ochoa is a 35 y.o. G1P0 female at approx 26 weeks 5 days who presents with minimal vaginal bleeding and crampy abdominal pain. She has not yet been seen for prenatal care and requests [place] to know gender today. Initial prenatal appointment scheduled at [facility] on 11/27/20.  #VaginalBleeding: Cervical exam performed. Minimal amount of dark blood visualized. Cervix closed. UA, wet prep & GC/Chlamydia probe collected to assess for causes of bleeding.  #AbdominalPain: Physical exam today revealed fetal head at RUQ, likely responsible for crampy abdominal pain.  #LatePNC: Patient encouraged to attend visit on 6/2 and provided with information about [facility] who can do [place] to determine gender.   Stefani Dama 11/17/2020, 1:39 PM   Attestation of Supervision of Student:  I  confirm that I have verified the information documented in the medical student's note and that I have also personally reperformed the history, physical exam and all medical decision making activities.  I have verified that all services and findings are accurately documented in this student's note; and I agree with management and plan as outlined in the documentation. I have also made any necessary editorial changes.  See my MAU note for full documentation.  Gabriel Carina, [place] for [employer], [employer] 11/17/2020 4:19 PM

## 2020-11-17 NOTE — MAU Provider Note (Signed)
Chief Complaint:  Vaginal Bleeding and Abdominal Pain   Event Date/Time   First Provider Initiated Contact with Patient 11/17/20 1322     HPI: Stacey Ochoa is a 35 y.o. G1P0000 at [redacted]w[redacted]d who presents to maternity admissions reporting vaginal discharge that was light pink this morning then brown tinged. Last IC at the beginning of May. 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 Denies heavy to moderate vaginal bleeding, leaking of fluid, decreased fetal movement, fever, falls, or recent illness.   Pregnancy Course: Late to [facility] because she did not know she was pregnant until 3wks ago. Has an appt at [facility] on 11/27/20.  Past Medical History:  Diagnosis Date  . Medical history non-contributory    OB History  Gravida Para Term Preterm AB Living  1 0 0 0 0 0  SAB IAB Ectopic Multiple Live Births  0 0 0 0 0    # Outcome Date GA Lbr Len/2nd Weight Sex Delivery Anes PTL Lv  1 Current            Past Surgical History:  Procedure Laterality Date  . NO PAST SURGERIES     History reviewed. No pertinent family history. Social History   Tobacco Use  . Smoking status: Current Every Day Smoker  . Smokeless tobacco: Never Used  Substance Use Topics  . Alcohol use: Yes  . Drug use: No   No Known Allergies Medications Prior to Admission  Medication Sig Dispense Refill Last Dose  . diclofenac (CATAFLAM) 50 MG tablet Take 1 tablet (50 mg total) by mouth 3 (three) times daily. For chest pain 21 tablet 0   . docusate sodium (COLACE) 100 MG capsule Take 1 capsule (100 mg total) by mouth every 12 (twelve) hours. 60 capsule 0   . drospirenone-ethinyl estradiol (YAZ,GIANVI,LORYNA) 3-0.02 MG tablet Take 1 tablet by mouth daily.     . pantoprazole (PROTONIX) 20 MG tablet Take 1 tablet (20 mg total) by mouth daily. 60  tablet 0   . pantoprazole (PROTONIX) 40 MG tablet Take 1 tablet (40 mg total) by mouth daily. 30 tablet 0   . polyethylene glycol (MIRALAX / GLYCOLAX) packet Take 17 g by mouth daily. 11 each 0     I have reviewed patient's Past Medical Hx, Surgical Hx, Family Hx, Social Hx, medications and allergies.   ROS:  Pertinent items noted in HPI and remainder of comprehensive ROS otherwise negative.  Physical Exam   Patient Vitals for the past 24 hrs:  BP Temp Temp src Pulse Resp SpO2 Height Weight  11/17/20 1604 113/65 -- -- 78 18 -- -- --  11/17/20 1247 119/76 98.3 F (36.8 C) Oral 98 18 99 % 5' 6.5" (1.689 m) 230 lb 12.8 oz (104.7 kg)   Constitutional: Well-developed, well-nourished female in no acute distress.  Cardiovascular: normal rate & rhythm, no murmur Respiratory: normal effort, lung sounds clear throughout GI: Abd soft, non-tender to light and deep palpation, gravid appropriate for gestational age. Pos BS x 4 MS: Extremities nontender, no edema, normal ROM Neurologic: Alert and oriented x 4.  GU: no CVA tenderness Pelvic: NEFG, physiologic discharge with scant amount of brown discharge noted, no blood, cervix clean.   Fetal Tracing: reactive Baseline: 145 Variability: moderate Accelerations: 10x10 (appropriate for gestational age) Decelerations: none Toco: relaxed with occasional  UI  Via Leopold's - infant noted to be breech with head to the maternal right (exactly where pt has noted the tightness/dull ache.   Labs: Results for orders placed or performed during the hospital encounter of 11/17/20 (from the past 24 hour(s))  Urinalysis, Routine w reflex microscopic Urine, Clean Catch     Status: Abnormal   Collection Time: 11/17/20 12:52 PM  Result Value Ref Range   Color, Urine AMBER (A) YELLOW   APPearance HAZY (A) CLEAR   Specific Gravity, Urine 1.031 (H) 1.005 - 1.030   pH 5.0 5.0 - 8.0   Glucose, UA NEGATIVE NEGATIVE mg/dL   Hgb urine dipstick NEGATIVE NEGATIVE    Bilirubin Urine NEGATIVE NEGATIVE   Ketones, ur 5 (A) NEGATIVE mg/dL   Protein, ur 30 (A) NEGATIVE mg/dL   Nitrite NEGATIVE NEGATIVE   Leukocytes,Ua TRACE (A) NEGATIVE   RBC / HPF 0-5 0 - 5 RBC/hpf   WBC, UA 0-5 0 - 5 WBC/hpf   Bacteria, UA NONE SEEN NONE SEEN   Squamous Epithelial / LPF 0-5 0 - 5   Mucus PRESENT   Wet prep, genital     Status: Abnormal   Collection Time: 11/17/20  2:09 PM   Specimen: Vaginal  Result Value Ref Range   Yeast Wet Prep HPF POC NONE SEEN NONE SEEN   Trich, Wet Prep NONE SEEN NONE SEEN   Clue Cells Wet Prep HPF POC NONE SEEN NONE SEEN   WBC, Wet Prep HPF POC MANY (A) NONE SEEN   Sperm NONE SEEN    Imaging:  No results found.  MAU Course: Orders Placed This Encounter  Procedures  . Wet prep, genital  . Urinalysis, Routine w reflex microscopic Urine, Clean Catch  . Discharge patient   Meds ordered this encounter  Medications  . Prenatal Vit-Fe Fumarate-FA (PREPLUS) 27-1 MG TABS    Sig: Take 1 tablet by mouth daily.    Dispense:  30 tablet    Refill:  13    Order Specific Question:   Supervising Provider    Answer:   Donnamae Jude [[id number]]   MDM: No abnormality/blood found on speculum exam or wet prep.  Reassured patient of results. NST reactive with palpable movement and no active bleeding so U/S not indicated. Pt verbalized twice that she'd like to know the baby's gender. Explained twice that MAU is for emergent ultrasounds but that they would order one at her first visit in June. Also gave list of boutique locations for gender reveal planning.  Assessment: 1. Vaginal discharge in pregnancy, second trimester   2. NST (non-stress test) reactive     Plan: Discharge home in stable condition with second trimester precautions.  - will follow results of GC/CT and manage accordingly    Follow-up [facility] on 11/27/2020.   Specialty: Obstetrics and Gynecology Why: 8am for glucose testing (see  AVS for instructions) 8:15am for visit with Dr. Shary Decamp information: [address] [place] [phone]                Allergies as of 11/17/2020   No Known Allergies      Medication List     STOP taking these medications    diclofenac 50 MG tablet Commonly known as: CATAFLAM   docusate sodium 100 MG capsule Commonly known as: COLACE   drospirenone-ethinyl estradiol 3-0.02 MG tablet Commonly known as: YAZ  pantoprazole 20 MG tablet Commonly known as: PROTONIX   pantoprazole 40 MG tablet Commonly known as: Protonix   polyethylene glycol 17 g packet Commonly known as: MIRALAX / GLYCOLAX       TAKE these medications    PrePLUS 27-1 MG Tabs Take 1 tablet by mouth daily.        Gaylan Gerold, CNM, MSN, [place] Certified Nurse Midwife, [facility]

## 2020-11-17 NOTE — MAU Note (Signed)
+  Preg test at [facility]  About 3 wks ago.  Made an appt with a dr, appt is 6/2.  Noted some spotting this morning- light pink.   When she went to the bathroom some "mucous fell out"- dk brown in color. Little pain on the sides, first noted yesterday- comes and goes "doesn't stay long". Had been on birth control- that made her periods irreg.  Stopped in Feb because she thought it was making her sick.

## 2020-11-17 NOTE — Discharge Instructions (Signed)
Second Trimester of Pregnancy  The second trimester of pregnancy is from week 13 through week 27. This is also called months 4 through 6 of pregnancy. This is often the time when you feel your best. During the second trimester:  Morning sickness is less or has stopped.  You may have more energy.  You may feel hungry more often. At this time, your unborn baby (fetus) is growing very fast. At the end of the sixth month, the unborn baby may be up to 12 inches long and weigh about 1 pounds. You will likely start to feel the baby move between 16 and 20 weeks of pregnancy. Body changes during your second trimester Your body continues to go through many changes during this time. The changes vary and generally return to normal after the baby is born. Physical changes  You will gain more weight.  You may start to get stretch marks on your hips, belly (abdomen), and breasts.  Your breasts will grow and may hurt.  Dark spots or blotches may develop on your face.  A dark line from your belly button to the pubic area (linea nigra) may appear.  You may have changes in your hair. Health changes  You may have headaches.  You may have heartburn.  You may have trouble pooping (constipation).  You may have hemorrhoids or swollen, bulging veins (varicose veins).  Your gums may bleed.  You may pee (urinate) more often.  You may have back pain. Follow these instructions at home: Medicines  Take over-the-counter and prescription medicines only as told by your doctor. Some medicines are not safe during pregnancy.  Take a prenatal vitamin that contains at least 600 micrograms (mcg) of folic acid. Eating and drinking  Eat healthy meals that include: ? Fresh fruits and vegetables. ? Whole grains. ? Good sources of protein, such as meat, eggs, or tofu. ? Low-fat dairy products.  Avoid raw meat and unpasteurized juice, milk, and cheese.  You may need to take these actions to prevent or  treat trouble pooping: ? Drink enough fluids to keep your pee (urine) pale yellow. ? Eat foods that are high in fiber. These include beans, whole grains, and fresh fruits and vegetables. ? Limit foods that are high in fat and sugar. These include fried or sweet foods. Activity  Exercise only as told by your doctor. Most people can do their usual exercise during pregnancy. Try to exercise for 30 minutes at least 5 days a week.  Stop exercising if you have pain or cramps in your belly or lower back.  Do not exercise if it is too hot or too humid, or if you are in a place of great height (high altitude).  Avoid heavy lifting.  If you choose to, you may have sex unless your doctor tells you not to. Relieving pain and discomfort  Wear a good support bra if your breasts are sore.  Take warm water baths (sitz baths) to soothe pain or discomfort caused by hemorrhoids. Use hemorrhoid cream if your doctor approves.  Rest with your legs raised (elevated) if you have leg cramps or low back pain.  If you develop bulging veins in your legs: ? Wear support hose as told by your doctor. ? Raise your feet for 15 minutes, 3-4 times a day. ? Limit salt in your food. Safety  Wear your seat belt at all times when you are in a car.  Talk with your doctor if someone is hurting you or yelling  at you a lot. Lifestyle  Do not use hot tubs, steam rooms, or saunas.  Do not douche. Do not use tampons or scented sanitary pads.  Avoid cat litter boxes and soil used by cats. These carry germs that can harm your baby and can cause a loss of your baby by miscarriage or stillbirth.  Do not use herbal medicines, illegal drugs, or medicines that are not approved by your doctor. Do not drink alcohol.  Do not smoke or use any products that contain nicotine or tobacco. If you need help quitting, ask your doctor. General instructions  Keep all follow-up visits. This is important.  Ask your doctor about local  prenatal classes.  Ask your doctor about the right foods to eat or for help finding a counselor. Where to find more information  American Pregnancy Association: americanpregnancy.org  [employer] of Obstetricians and Gynecologists: www.acog.org  Office on [employer]: KeywordPortfolios.com.br Contact a doctor if:  You have a headache that does not go away when you take medicine.  You have changes in how you see, or you see spots in front of your eyes.  You have mild cramps, pressure, or pain in your lower belly.  You continue to feel like you may vomit (nauseous), you vomit, or you have watery poop (diarrhea).  You have bad-smelling fluid coming from your vagina.  You have pain when you pee or your pee smells bad.  You have very bad swelling of your face, hands, ankles, feet, or legs.  You have a fever. Get help right away if:  You are leaking fluid from your vagina.  You have spotting or bleeding from your vagina.  You have very bad belly cramping or pain.  You have trouble breathing.  You have chest pain.  You faint.  You have not felt your baby move for the time period told by your doctor.  You have new or increased pain, swelling, or redness in an arm or leg. Summary  The second trimester of pregnancy is from week 13 through week 27 (months 4 through 6).  Eat healthy meals.  Exercise as told by your doctor. Most people can do their usual exercise during pregnancy.  Do not use herbal medicines, illegal drugs, or medicines that are not approved by your doctor. Do not drink alcohol.  Call your doctor if you get sick or if you notice anything unusual about your pregnancy. This information is not intended to replace advice given to you by your health care provider. Make sure you discuss any questions you have with your health care provider. Document Revised: 11/21/2019 Document Reviewed: 09/27/2019 Elsevier Patient Education  2021 [employer].   Glucose Tolerance Test Why am I having this test? The glucose tolerance test (GTT) is done to check how your body processes sugar (glucose). This is one of several tests used to diagnose diabetes (diabetes mellitus). Your health care provider may recommend this test if you:  Have a family history of diabetes.  Are obese.  Have infections that keep coming back.  Have had a lot of wounds that did not heal quickly, especially on your legs and feet.  Are a woman and have a history of giving birth to very large babies or a history of repeated fetal loss (stillbirth).  Have had high glucose levels in your urine or blood: ? During a past pregnancy. ? After a heart attack, surgery, or prolonged periods of high stress. What is being tested? This test measures the amount  of glucose in your blood at different times during a period of 2 hours. This indicates how well your body is able to process glucose. What kind of sample is taken? Blood samples are required for this test. They are usually collected by inserting a needle into a blood vessel.   How do I prepare for this test?  For 3 days before your test, eat normally. Have plenty of carbohydrate-rich foods.  Follow instructions from your health care provider about: ? Eating or drinking restrictions on the day of the test. You may be asked to not eat or drink anything other than water (fast) starting 8-12 hours before the test. ? Changing or stopping your regular medicines. Some medicines may interfere with this test. Tell a health care provider about:  All medicines you are taking, including vitamins, herbs, eye drops, creams, and over-the-counter medicines.  Any blood disorders you have.  Any surgeries you have had.  Any medical conditions you have.  Whether you are pregnant or may be pregnant. What happens during the test? First, your blood glucose will be measured. This is referred to as your fasting blood glucose, since  you fasted before the test. Then, you will drink a glucose solution that contains a specific amount of glucose. Your blood glucose will be measured again 1 and 2 hours after drinking the solution. This test takes 2 hours to complete. You will need to stay at the testing location during this time. During the testing period:  Do not eat or drink anything other than the glucose solution. You will be allowed to drink water.  Do not exercise.  Do not use any products that contain nicotine or tobacco. These products include cigarettes, chewing tobacco, and vaping devices, such as e-cigarettes. If you need help quitting, ask your health care provider. The testing procedure may vary among health care providers and hospitals. How are the results reported? Your test results will be reported as values. These will be given as milligrams of glucose per deciliter of blood (mg/dL) or millimoles per liter (mmol/L). Your health care provider will compare your results to normal ranges that were established after testing a large group of people (reference ranges). Reference ranges may vary among labs and hospitals. For this test, common reference ranges are:  Fasting: less than 110 mg/dL (6.1 mmol/L).  1 hour after drinking glucose: less than 180 mg/dL (10.0 mmol/L).  2 hours after drinking glucose: less than 140 mg/dL (7.8 mmol/L). What do the results mean? Results that are within the reference ranges are considered normal, meaning that your glucose levels are well controlled. Results higher than the reference ranges may mean that you recently experienced stress, such as from an injury or a sudden (acute) condition like a heart attack or stroke, or that you have:  Diabetes.  Cushing syndrome.  Tumors such as pheochromocytoma or glucagonoma.  Kidney failure.  Pancreatitis.  Hyperthyroidism.  An infection. Talk with your health care provider about what your results mean. Questions to ask your health  care provider Ask your health care provider, or the department that is doing the test:  When will my results be ready?  How will I get my results?  What are my treatment options?  What other tests do I need?  What are my next steps? Summary  The GTT is done to check how your body processes glucose. This is one of several tests used to diagnose diabetes.  This test measures the amount of glucose in your blood  at different times during a period of 2 hours. This indicates how well your body is able to process glucose.  Talk with your health care provider about what your results mean. This information is not intended to replace advice given to you by your health care provider. Make sure you discuss any questions you have with your health care provider. Document Revised: 03/12/2020 Document Reviewed: 03/12/2020 Elsevier Patient Education  2021 [employer].

## 2020-11-18 LAB — GC/CHLAMYDIA PROBE AMP (~~LOC~~) NOT AT ARMC
Chlamydia: NEGATIVE
Comment: NEGATIVE
Comment: NORMAL
Neisseria Gonorrhea: NEGATIVE

## 2020-11-27 ENCOUNTER — Ambulatory Visit (INDEPENDENT_AMBULATORY_CARE_PROVIDER_SITE_OTHER): Payer: 59 | Admitting: Obstetrics & Gynecology

## 2020-11-27 ENCOUNTER — Other Ambulatory Visit: Payer: Self-pay

## 2020-11-27 ENCOUNTER — Other Ambulatory Visit (HOSPITAL_COMMUNITY)
Admission: RE | Admit: 2020-11-27 | Discharge: 2020-11-27 | Disposition: A | Discharge status: Home / Self Care | Payer: 59 | Source: Ambulatory Visit | Attending: Obstetrics & Gynecology | Admitting: Obstetrics & Gynecology

## 2020-11-27 ENCOUNTER — Other Ambulatory Visit: Payer: 59

## 2020-11-27 VITALS — BP 123/87 | HR 69 | Wt 232.3 lb

## 2020-11-27 DIAGNOSIS — Z3402 Encounter for supervision of normal first pregnancy, second trimester: Secondary | ICD-10-CM | POA: Insufficient documentation

## 2020-11-27 DIAGNOSIS — O09523 Supervision of elderly multigravida, third trimester: Secondary | ICD-10-CM | POA: Diagnosis not present

## 2020-11-27 NOTE — Patient Instructions (Signed)

## 2020-11-27 NOTE — Progress Notes (Signed)
Was on [facility] for many years. Restarted in November. Was seen in MAU 5/23 for vaginal bleeding. Declined genetic screening today.

## 2020-11-27 NOTE — Progress Notes (Signed)
Late to care Subjective:    Stacey Ochoa is a G1P0000 [redacted]w[redacted]d being seen today for her first obstetrical visit.  Her obstetrical history is significant for advanced maternal age and late prenatal care. Patient does intend to breast feed. Pregnancy history fully reviewed.  Patient reports no complaints.  Vitals:   11/27/20 0819  BP: 123/87  Pulse: 69  Weight: 232 lb 4.8 oz (105.4 kg)    HISTORY: OB History  Gravida Para Term Preterm AB Living  1 0 0 0 0 0  SAB IAB Ectopic Multiple Live Births  0 0 0 0 0    # Outcome Date GA Lbr Len/2nd Weight Sex Delivery Anes PTL Lv  1 Current            Past Medical History:  Diagnosis Date  . Medical history non-contributory    Past Surgical History:  Procedure Laterality Date  . NO PAST SURGERIES     No family history on file.   Exam    Uterus:     Pelvic Exam:    Perineum: No Hemorrhoids   Vulva: normal   Vagina:  normal mucosa   pH:     Cervix: no lesions   Adnexa: not evaluated   Bony Pelvis: average  System: Breast:  normal appearance, no masses or tenderness   Skin: normal coloration and turgor, no rashes    Neurologic: oriented, normal mood   Extremities: normal strength, tone, and muscle mass   HEENT PERRLA, extra ocular movement intact, neck supple with midline trachea and thyroid without masses   Mouth/Teeth mucous membranes moist, pharynx normal without lesions and dental hygiene good   Neck supple and no masses   Cardiovascular: regular rate and rhythm, no murmurs or gallops   Respiratory:  appears well, vitals normal, no respiratory distress, acyanotic, normal RR, neck free of mass or lymphadenopathy, chest clear, no wheezing, crepitations, rhonchi, normal symmetric air entry   Abdomen: gravid   Urinary: urethral meatus normal      Assessment:    Pregnancy: G1P0000 Patient Active Problem List   Diagnosis Date Noted  . Encounter for supervision of normal first pregnancy in second trimester 11/17/2020         Plan:     Initial labs drawn. Prenatal vitamins. Problem list reviewed and updated. Genetic Screening discussed .  Ultrasound discussed; fetal survey: ordered.  Follow up in 2 weeks. 50% of 30 min visit spent on counseling and coordination of care.  2 hr GTT. Tentative dating based on limited US at 24 weeks at [facility]   Emeterio Reeve 11/27/2020

## 2020-11-28 ENCOUNTER — Encounter (HOSPITAL_COMMUNITY): Payer: Self-pay | Admitting: Obstetrics & Gynecology

## 2020-11-28 ENCOUNTER — Inpatient Hospital Stay (HOSPITAL_COMMUNITY)
Admission: AD | Admit: 2020-11-28 | Discharge: 2020-11-28 | Disposition: A | Discharge status: Home / Self Care | Payer: 59 | Attending: Obstetrics & Gynecology | Admitting: Obstetrics & Gynecology

## 2020-11-28 ENCOUNTER — Inpatient Hospital Stay (HOSPITAL_BASED_OUTPATIENT_CLINIC_OR_DEPARTMENT_OTHER): Payer: 59

## 2020-11-28 DIAGNOSIS — O4393 Unspecified placental disorder, third trimester: Secondary | ICD-10-CM | POA: Diagnosis not present

## 2020-11-28 DIAGNOSIS — N939 Abnormal uterine and vaginal bleeding, unspecified: Secondary | ICD-10-CM

## 2020-11-28 DIAGNOSIS — O4693 Antepartum hemorrhage, unspecified, third trimester: Secondary | ICD-10-CM | POA: Diagnosis not present

## 2020-11-28 DIAGNOSIS — Z3402 Encounter for supervision of normal first pregnancy, second trimester: Secondary | ICD-10-CM

## 2020-11-28 DIAGNOSIS — Z3A28 28 weeks gestation of pregnancy: Secondary | ICD-10-CM | POA: Insufficient documentation

## 2020-11-28 DIAGNOSIS — Z87891 Personal history of nicotine dependence: Secondary | ICD-10-CM | POA: Diagnosis not present

## 2020-11-28 DIAGNOSIS — O0933 Supervision of pregnancy with insufficient antenatal care, third trimester: Secondary | ICD-10-CM

## 2020-11-28 LAB — OBSTETRIC PANEL, INCLUDING HIV
Antibody Screen: NEGATIVE
Basophils Absolute: 0 10*3/uL (ref 0.0–0.2)
Basos: 0 %
EOS (ABSOLUTE): 0.1 10*3/uL (ref 0.0–0.4)
Eos: 1 %
HIV Screen 4th Generation wRfx: NONREACTIVE
Hematocrit: 34.6 % (ref 34.0–46.6)
Hemoglobin: 10.9 g/dL — ABNORMAL LOW (ref 11.1–15.9)
Hepatitis B Surface Ag: NEGATIVE
Immature Grans (Abs): 0 10*3/uL (ref 0.0–0.1)
Immature Granulocytes: 0 %
Lymphocytes Absolute: 1.7 10*3/uL (ref 0.7–3.1)
Lymphs: 21 %
MCH: 26.4 pg — ABNORMAL LOW (ref 26.6–33.0)
MCHC: 31.5 g/dL (ref 31.5–35.7)
MCV: 84 fL (ref 79–97)
Monocytes Absolute: 0.6 10*3/uL (ref 0.1–0.9)
Monocytes: 8 %
Neutrophils Absolute: 5.5 10*3/uL (ref 1.4–7.0)
Neutrophils: 70 %
Platelets: 366 10*3/uL (ref 150–450)
RBC: 4.13 x10E6/uL (ref 3.77–5.28)
RDW: 14.4 % (ref 11.7–15.4)
RPR Ser Ql: NONREACTIVE
Rh Factor: POSITIVE
Rubella Antibodies, IGG: <0.9 index — ABNORMAL LOW (ref >0.99)
WBC: 8 10*3/uL (ref 3.4–10.8)

## 2020-11-28 LAB — GLUCOSE TOLERANCE, 2 HOURS W/ 1HR
Glucose, 1 hour: 95 mg/dL (ref 65–179)
Glucose, 2 hour: 69 mg/dL (ref 65–152)
Glucose, Fasting: 82 mg/dL (ref 65–91)

## 2020-11-28 LAB — HEPATITIS C ANTIBODY: Hep C Virus Ab: <0.1 s/co ratio (ref 0.0–0.9)

## 2020-11-28 NOTE — MAU Provider Note (Addendum)
Patient Stacey Ochoa is 35 y.o. G1P0000 at [redacted]w[redacted]d here with complaints of brownish-reddish discharge one time today at 1830 after having a BM. She denies vaginal pain, contractions, LOF, DM, HTN. She denies fevers, SOB,   She denies recent IC; last intercourse was in early May. She had GC CT testing on 11/17/2020; all results were negative.    History     CSN: [id number]  Arrival date and time: 11/28/20 1844   Event Date/Time   First Provider Initiated Contact with Patient 11/28/20 2001      Chief Complaint  Patient presents with  . Vaginal Bleeding   Vaginal Bleeding The patient's primary symptoms include vaginal discharge. This is a recurrent problem. The current episode started in the past 7 days. The problem occurs intermittently. Progression since onset: happened once today and once on 11/17/2020. The patient is experiencing no pain. She is pregnant. Pertinent negatives include no abdominal pain, back pain, chills, constipation, diarrhea, dysuria, fever, sore throat or urgency. The vaginal discharge was brown. The vaginal bleeding is spotting. She has not been passing clots. She has not been passing tissue. She is not sexually active.    OB History    Gravida  1   Para  0   Term  0   Preterm  0   AB  0   Living  0     SAB  0   IAB  0   Ectopic  0   Multiple  0   Live Births  0           Past Medical History:  Diagnosis Date  . Medical history non-contributory     Past Surgical History:  Procedure Laterality Date  . NO PAST SURGERIES      History reviewed. No pertinent family history.  Social History   Tobacco Use  . Smoking status: Former Research scientist (life sciences)  . Smokeless tobacco: Never Used  Substance Use Topics  . Alcohol use: Yes  . Drug use: No    Allergies: No Known Allergies  Medications Prior to Admission  Medication Sig Dispense Refill Last Dose  . Prenatal Vit-Fe Fumarate-FA (PREPLUS) 27-1 MG TABS Take 1 tablet by mouth daily. 30 tablet 13  11/28/2020 at Unknown time    Review of Systems  Constitutional: Negative.  Negative for chills and fever.  HENT: Negative.  Negative for sore throat.   Respiratory: Negative.   Cardiovascular: Negative.   Gastrointestinal: Negative for abdominal pain, constipation and diarrhea.  Genitourinary: Positive for vaginal bleeding and vaginal discharge. Negative for dysuria and urgency.  Musculoskeletal: Negative.  Negative for back pain.  Neurological: Negative.   Hematological: Negative.   Psychiatric/Behavioral: Negative.    Physical Exam   Blood pressure 120/73, pulse 70, temperature 98.1 F (36.7 C), resp. rate 20, height 5\' 6"  (1.676 m), weight 105.7 kg.  Physical Exam Constitutional:      Appearance: Normal appearance.  Abdominal:     General: Abdomen is flat.  Genitourinary:    General: Normal vulva.     Vagina: No vaginal discharge.     Comments: NEFG; no blood in vagina, no discharge, no CMT, suprapubic tenderness. Cervix is long, closed, thick.  Musculoskeletal:        General: Normal range of motion.  Skin:    General: Skin is warm and dry.  Neurological:     Mental Status: She is alert.     MAU Course  Procedures  MDM -NST: 135 bpm, mod var, present acel, no decels,  uterine irritability.  Patient had [place] in MAU to check for placental location> prelim report shows that placenta is anterior with no sign of previa or abruption.   -Did not collect GC CT/ wet prep as patient just had cultures on 11/17/2020 and has not been sexually active since beginning of May  -cervix is long, closed,  And patient has no complaints of pain, low index of suspicion for preterm labor at this time.   Assessment and Plan   1. Complaint of vaginal bleeding   2. Encounter for supervision of normal first pregnancy in second trimester    -Discussed placenta location and discussed fibroids, reassurance and support offered.  -Discussed with patient that small stomach was noted on [place] today;  difficult to interpret findings as patient has not had full anatomy scan or genetic testing. Scan is scheduled for 12/16/2020 and has prenatal visit on 12/11/2020. Reassurance and support given to patient, including that there would be no change in management at this time. SHe has not done genetic screening.  -return bleeding precautions, decreased movements, signs of labor and when to return to MAU; all questions answered.   Mervyn Skeeters Adaly Puder 11/28/2020, 8:05 PM

## 2020-11-28 NOTE — MAU Note (Signed)
PT SAYS SHE HAS PINK SPOTTING - STARTED  AT 6 PM- IN B-ROOM- WHEN SHE WIPES   PNC WITH [facility] - THERE YESTERDAY - SAW DR ARNOLD - PAP SMEAR, VE , GLUCOSE TEST ,  NO PAIN.

## 2020-11-28 NOTE — Discharge Instructions (Signed)
Vaginal Bleeding During Pregnancy, Third Trimester A small amount of bleeding, or spotting, from the vagina is common during early pregnancy. Sometimes the bleeding is normal and is not a problem, and sometimes it is a sign of something serious. In the third trimester, normal bleeding can happen:  Because of changes in your blood vessels.  When you have sex.  When you have pelvic exams. During this time, some abnormal things can cause bleeding. These include:  Infection in the womb.  Growths in the lowest part of the womb (cervix). These growths are also called polyps.  Problems of the placenta. The placenta may: ? Block the opening of the cervix. ? Break away from the womb. ? Grow into the muscle of the womb.  Early labor. Tell your doctor right away about any bleeding from your vagina. Follow these instructions at home: Watch your bleeding  Watch your condition for any changes. Let your doctor know if you are worried about something.  Try to know what causes your bleeding. Ask yourself these questions: ? Does the bleeding start on its own? ? Does the bleeding start after something is done, such as sex or a pelvic exam?  Use a diary to write the things you see about your bleeding. Write in your diary: ? If the bleeding flows freely without stopping, or if it starts and stops, and then starts again. ? If the bleeding is heavy or light. ? How many pads you use in a day and how much blood is in them.  Tell your doctor if you pass tissue. He or she may want to see it.   Activity  Follow your doctor's instructions about how active you can be. Your doctor may recommend that you: ? Stay in bed and only get up to use the bathroom. ? Continue light activity.  Ask your doctor if it is safe for you to drive.  Do not lift anything that is heavier than 10 lb (4.5 kg), or the limit that you are told.  Do not have sex until your doctor says that this is safe.  If needed, make plans  for someone to help you with your normal activities. Medicines  Take over-the-counter and prescription medicines only as told by your doctor.  Do not take aspirin. It can cause bleeding. General instructions  Do not use tampons.  Do not douche.  Keep all follow-up visits. Contact a doctor if:  You have vaginal bleeding at any time during pregnancy.  You have cramps.  You have a fever. Get help right away if:  You have very bad cramps.  You have very bad pain in your back or belly (abdomen).  You have a gush of fluid from your vagina.  Your bleeding gets worse.  You pass large clots or a lot of tissue from your vagina.  You feel light-headed or weak.  You pass out (faint).  Your baby is moving less than usual, or not moving at all. Summary  A small amount of bleeding is normal during pregnancy. Some bleeding may be caused by more serious problems.  Tell your doctor right away about any bleeding from your vagina.  Follow instructions from your doctor about how active you can be. You may need someone to help you with your normal activities. This information is not intended to replace advice given to you by your health care provider. Make sure you discuss any questions you have with your health care provider. Document Revised: 03/06/2020 Document Reviewed: 03/06/2020  Elsevier Patient Education  2021 [employer].

## 2020-12-01 DIAGNOSIS — O09523 Supervision of elderly multigravida, third trimester: Secondary | ICD-10-CM | POA: Insufficient documentation

## 2020-12-01 LAB — CYTOLOGY - PAP
Comment: NEGATIVE
Diagnosis: NEGATIVE
High risk HPV: NEGATIVE

## 2020-12-01 LAB — URINE CULTURE, OB REFLEX

## 2020-12-01 LAB — CULTURE, OB URINE

## 2020-12-06 ENCOUNTER — Inpatient Hospital Stay (HOSPITAL_BASED_OUTPATIENT_CLINIC_OR_DEPARTMENT_OTHER): Payer: 59

## 2020-12-06 ENCOUNTER — Inpatient Hospital Stay (HOSPITAL_COMMUNITY)
Admission: AD | Admit: 2020-12-06 | Discharge: 2020-12-15 | DRG: 786 | Disposition: A | Discharge status: Home / Self Care | Payer: 59 | Attending: Obstetrics and Gynecology | Admitting: Obstetrics and Gynecology

## 2020-12-06 ENCOUNTER — Other Ambulatory Visit: Payer: Self-pay

## 2020-12-06 ENCOUNTER — Encounter (HOSPITAL_COMMUNITY): Payer: Self-pay | Admitting: Obstetrics and Gynecology

## 2020-12-06 DIAGNOSIS — O0933 Supervision of pregnancy with insufficient antenatal care, third trimester: Secondary | ICD-10-CM

## 2020-12-06 DIAGNOSIS — O99213 Obesity complicating pregnancy, third trimester: Secondary | ICD-10-CM | POA: Diagnosis not present

## 2020-12-06 DIAGNOSIS — Z3A29 29 weeks gestation of pregnancy: Secondary | ICD-10-CM

## 2020-12-06 DIAGNOSIS — E669 Obesity, unspecified: Secondary | ICD-10-CM

## 2020-12-06 DIAGNOSIS — O4693 Antepartum hemorrhage, unspecified, third trimester: Secondary | ICD-10-CM

## 2020-12-06 DIAGNOSIS — Z20822 Contact with and (suspected) exposure to covid-19: Secondary | ICD-10-CM | POA: Diagnosis present

## 2020-12-06 DIAGNOSIS — O3430 Maternal care for cervical incompetence, unspecified trimester: Secondary | ICD-10-CM | POA: Diagnosis present

## 2020-12-06 DIAGNOSIS — O3433 Maternal care for cervical incompetence, third trimester: Secondary | ICD-10-CM

## 2020-12-06 DIAGNOSIS — O3413 Maternal care for benign tumor of corpus uteri, third trimester: Secondary | ICD-10-CM | POA: Diagnosis present

## 2020-12-06 DIAGNOSIS — O328XX0 Maternal care for other malpresentation of fetus, not applicable or unspecified: Secondary | ICD-10-CM | POA: Diagnosis present

## 2020-12-06 DIAGNOSIS — D252 Subserosal leiomyoma of uterus: Secondary | ICD-10-CM | POA: Diagnosis present

## 2020-12-06 DIAGNOSIS — O321XX0 Maternal care for breech presentation, not applicable or unspecified: Secondary | ICD-10-CM

## 2020-12-06 DIAGNOSIS — O0930 Supervision of pregnancy with insufficient antenatal care, unspecified trimester: Secondary | ICD-10-CM

## 2020-12-06 DIAGNOSIS — Z3490 Encounter for supervision of normal pregnancy, unspecified, unspecified trimester: Secondary | ICD-10-CM

## 2020-12-06 DIAGNOSIS — Z98891 History of uterine scar from previous surgery: Secondary | ICD-10-CM

## 2020-12-06 DIAGNOSIS — Z23 Encounter for immunization: Secondary | ICD-10-CM

## 2020-12-06 DIAGNOSIS — O329XX0 Maternal care for malpresentation of fetus, unspecified, not applicable or unspecified: Secondary | ICD-10-CM | POA: Diagnosis present

## 2020-12-06 DIAGNOSIS — Z87891 Personal history of nicotine dependence: Secondary | ICD-10-CM | POA: Diagnosis not present

## 2020-12-06 DIAGNOSIS — D259 Leiomyoma of uterus, unspecified: Secondary | ICD-10-CM | POA: Diagnosis present

## 2020-12-06 DIAGNOSIS — Z3A30 30 weeks gestation of pregnancy: Secondary | ICD-10-CM | POA: Diagnosis not present

## 2020-12-06 DIAGNOSIS — O289 Unspecified abnormal findings on antenatal screening of mother: Secondary | ICD-10-CM | POA: Diagnosis not present

## 2020-12-06 LAB — TYPE AND SCREEN
ABO/RH(D): O POS
Antibody Screen: NEGATIVE

## 2020-12-06 LAB — RESP PANEL BY RT-PCR (FLU A&B, COVID) ARPGX2
Influenza A by PCR: NEGATIVE
Influenza B by PCR: NEGATIVE
SARS Coronavirus 2 by RT PCR: NEGATIVE

## 2020-12-06 LAB — CBC
HCT: 38.1 % (ref 36.0–46.0)
Hemoglobin: 11.8 g/dL — ABNORMAL LOW (ref 12.0–15.0)
MCH: 26.5 pg (ref 26.0–34.0)
MCHC: 31 g/dL (ref 30.0–36.0)
MCV: 85.6 fL (ref 80.0–100.0)
Platelets: 348 10*3/uL (ref 150–400)
RBC: 4.45 MIL/uL (ref 3.87–5.11)
RDW: 14.4 % (ref 11.5–15.5)
WBC: 10.4 10*3/uL (ref 4.0–10.5)
nRBC: 0 % (ref 0.0–0.2)

## 2020-12-06 LAB — URINALYSIS, ROUTINE W REFLEX MICROSCOPIC
Bilirubin Urine: NEGATIVE
Glucose, UA: NEGATIVE mg/dL
Ketones, ur: 80 mg/dL — AB
Nitrite: NEGATIVE
Protein, ur: NEGATIVE mg/dL
Specific Gravity, Urine: 1.009 (ref 1.005–1.030)
pH: 6 (ref 5.0–8.0)

## 2020-12-06 LAB — WET PREP, GENITAL
Clue Cells Wet Prep HPF POC: NONE SEEN
Sperm: NONE SEEN
Trich, Wet Prep: NONE SEEN
Yeast Wet Prep HPF POC: NONE SEEN

## 2020-12-06 LAB — RAPID URINE DRUG SCREEN, HOSP PERFORMED
Amphetamines: NOT DETECTED
Barbiturates: NOT DETECTED
Benzodiazepines: NOT DETECTED
Cocaine: NOT DETECTED
Opiates: NOT DETECTED
Tetrahydrocannabinol: NOT DETECTED

## 2020-12-06 MED ORDER — LACTATED RINGERS IV SOLN: INTRAVENOUS | Status: DC

## 2020-12-06 MED ORDER — DOCUSATE SODIUM 100 MG PO CAPS
100.0000 mg | ORAL_CAPSULE | Freq: Every day | ORAL | Status: DC
  Administered 2020-12-07 – 2020-12-10 (×4): 100 mg via ORAL
  Filled 2020-12-06 (×4): qty 1

## 2020-12-06 MED ORDER — MAGNESIUM SULFATE 40 GM/1000ML IV SOLN
2.0000 g/h | INTRAVENOUS | Status: AC
  Administered 2020-12-07: 2 g/h via INTRAVENOUS
  Filled 2020-12-06 (×2): qty 1000

## 2020-12-06 MED ORDER — MAGNESIUM SULFATE BOLUS VIA INFUSION
4.0000 g | Freq: Once | INTRAVENOUS | Status: AC
  Administered 2020-12-06: 4 g via INTRAVENOUS
  Filled 2020-12-06: qty 1000

## 2020-12-06 MED ORDER — BETAMETHASONE SOD PHOS & ACET 6 (3-3) MG/ML IJ SUSP
12.0000 mg | INTRAMUSCULAR | Status: AC
  Administered 2020-12-06 – 2020-12-07 (×2): 12 mg via INTRAMUSCULAR
  Filled 2020-12-06: qty 5

## 2020-12-06 MED ORDER — LACTATED RINGERS IV SOLN
INTRAVENOUS | Status: DC
Start: 2020-12-06 — End: 2020-12-11

## 2020-12-06 MED ORDER — ACETAMINOPHEN 325 MG PO TABS: 650.0000 mg | ORAL_TABLET | ORAL | Status: DC | PRN

## 2020-12-06 MED ORDER — ZOLPIDEM TARTRATE 5 MG PO TABS: 5.0000 mg | ORAL_TABLET | Freq: Every evening | ORAL | Status: DC | PRN

## 2020-12-06 MED ORDER — LACTATED RINGERS IV BOLUS
1000.0000 mL | Freq: Once | INTRAVENOUS | Status: AC
  Administered 2020-12-06: 1000 mL via INTRAVENOUS

## 2020-12-06 MED ORDER — PRENATAL MULTIVITAMIN CH
1.0000 | ORAL_TABLET | Freq: Every day | ORAL | Status: DC
  Administered 2020-12-07 – 2020-12-10 (×4): 1 via ORAL
  Filled 2020-12-06 (×4): qty 1

## 2020-12-06 MED ORDER — CALCIUM CARBONATE ANTACID 500 MG PO CHEW: 2.0000 | CHEWABLE_TABLET | ORAL | Status: DC | PRN

## 2020-12-06 NOTE — H&P (Signed)
History     CSN: [id number]  Arrival date and time: 12/06/20 1211   Event Date/Time   First Provider Initiated Contact with Patient 12/06/20 1234      Chief Complaint  Patient presents with   Vaginal Bleeding   HPI Stacey Ochoa is a 35 y.o. G1P0000 at [redacted]w[redacted]d who presents with vaginal bleeding. She states yesterday she saw some bright pink spotting when she wiped. She reports it never became bright red and she denies any pain. She reports it is lighter today. She denies any leaking of fluid. She reports normal fetal movement.   She was seen on 6/3 with similar complaints but had no bleeding on exam with a normal ultrasound. It was thought to be a result of her pap smear the day before.   OB History     Gravida  1   Para  0   Term  0   Preterm  0   AB  0   Living  0      SAB  0   IAB  0   Ectopic  0   Multiple  0   Live Births  0           Past Medical History:  Diagnosis Date   Medical history non-contributory     Past Surgical History:  Procedure Laterality Date   NO PAST SURGERIES      History reviewed. No pertinent family history.  Social History   Tobacco Use   Smoking status: Former    Pack years: 0.00   Smokeless tobacco: Never  Substance Use Topics   Alcohol use: Yes   Drug use: No    Allergies: No Known Allergies  Medications Prior to Admission  Medication Sig Dispense Refill Last Dose   Prenatal Vit-Fe Fumarate-FA (PREPLUS) 27-1 MG TABS Take 1 tablet by mouth daily. 30 tablet 13     Review of Systems  Constitutional: Negative.  Negative for fatigue and fever.  HENT: Negative.    Respiratory: Negative.  Negative for shortness of breath.   Cardiovascular: Negative.  Negative for chest pain.  Gastrointestinal: Negative.  Negative for abdominal pain, constipation, diarrhea, nausea and vomiting.  Genitourinary:  Positive for vaginal bleeding. Negative for dysuria and vaginal discharge.  Neurological: Negative.  Negative for  dizziness and headaches.  Physical Exam   Blood pressure 112/68, pulse 92, temperature 98.3 F (36.8 C), temperature source Oral, resp. rate 16, SpO2 100 %.  Physical Exam Vitals and nursing note reviewed.  Constitutional:      General: She is not in acute distress.    Appearance: She is well-developed.  HENT:     Head: Normocephalic.  Eyes:     Pupils: Pupils are equal, round, and reactive to light.  Cardiovascular:     Rate and Rhythm: Normal rate and regular rhythm.     Heart sounds: Normal heart sounds.  Pulmonary:     Effort: Pulmonary effort is normal. No respiratory distress.     Breath sounds: Normal breath sounds.  Abdominal:     General: Bowel sounds are normal. There is no distension.     Palpations: Abdomen is soft.     Tenderness: There is no abdominal tenderness.  Genitourinary:    Comments: SSE: friable cervix, small amount of membranes protruding from os, cervix appears to be 1-2cm Skin:    General: Skin is warm and dry.  Neurological:     Mental Status: She is alert and oriented to person, place,  and time.  Psychiatric:        Mood and Affect: Mood normal.        Behavior: Behavior normal.        Thought Content: Thought content normal.        Judgment: Judgment normal.   Fetal Tracing:  Baseline: 150 Variability: moderate Accels: 10x10 Decels: none  Toco: ui   MAU Course  Procedures  MDM UA Wet prep and gc/chlamydia LR  Dr. Elgie Congo notified of patient arrival and exam- will admit to [facility] for observation  US MFM OB Comp +14 weeks Mag [place] test  Assessment and Plan  -Premature cervical dilation  -Admit to [place] turned over to MD  [place] 12/06/2020, 12:34 PM

## 2020-12-06 NOTE — Consult Note (Signed)
Neonatology Consult Note:  At the request of the patients obstetrician Dr. Elgie Congo I met with Adaliz Dobis who is a 35 y.o. G1P0000 at 54w6dwho presents with vaginal bleeding.  Diagnosed with premature cervical dilation and managed with betamethasone and magnesium sulfate.    We discussed morbidity/mortality at this gestional age, delivery room resuscitation, including intubation and surfactant in DR.  Discussed mechanical ventilation and risk for chronic lung disease, risk for IVH with potential for motor / cognitive deficits, ROP, NEC, sepsis, as well as temperature instability and feeding immaturity.  Discussed NG / OG feeds, benefits of MBM in reducing incidence of NEC.   Discussed likely length of stay.  Thank you for allowing [place] participate in her care.  Please call with questions.  BHiginio Roger DO  Neonatologist  The total length of face-to-face or floor / unit time for this encounter was 25 minutes.  Counseling and / or coordination of care was greater than fifty percent of the time.

## 2020-12-06 NOTE — MAU Note (Signed)
Stacey Ochoa is a 35 y.o. at [redacted]w[redacted]d here in MAU reporting: spotting that started yesterday. States she sees pink bleeding when she wipes. No pain or LOF. +FM. No recent IC.  Onset of complaint: yesterday  Pain score: 0/10  Vitals:   12/06/20 1230  BP: 112/68  Pulse: 92  Resp: 16  Temp: 98.3 F (36.8 C)  SpO2: 100%     FHT: EFM applied in room  Lab orders placed from triage: UA

## 2020-12-07 DIAGNOSIS — O3430 Maternal care for cervical incompetence, unspecified trimester: Secondary | ICD-10-CM

## 2020-12-07 MED ORDER — LACTATED RINGERS IV BOLUS
1000.0000 mL | Freq: Once | INTRAVENOUS | Status: AC
  Administered 2020-12-07: 1000 mL via INTRAVENOUS

## 2020-12-07 NOTE — Progress Notes (Signed)
Daily Antepartum Note  Admission Date: 12/06/2020 Current Date: 12/07/2020 7:25 AM  Stacey Ochoa is a 35 y.o. G1P0000 @ [redacted]w[redacted]d , HD#2, admitted for vaginal spotting and advanced cervical dilation with exposed membranes.  Pregnancy complicated by: Late prenatal care Patient Active Problem List   Diagnosis Date Noted   Preterm labor in third trimester 12/06/2020   AMA (advanced maternal age) multigravida 31+, third trimester 12/01/2020   Encounter for supervision of normal first pregnancy in second trimester 11/17/2020    Overnight/24hr events:  none  Subjective:  Pt seen this morning.  She denies any loss of fluid, VB or ctx.  She is tolerating magnesium sulfate well.  S/P BMZ x 1.    Objective:   Vitals:   12/06/20 2300 12/07/20 0300  BP: 112/63 (!) 102/53  Pulse: 83 78  Resp: 17 16  Temp:  98 F (36.7 C)  SpO2: 97% 99%   Temp:  [97.5 F (36.4 C)-98.3 F (36.8 C)] 98 F (36.7 C) (06/12 0300) Pulse Rate:  [66-96] 78 (06/12 0300) Resp:  [16-19] 16 (06/12 0300) BP: (102-115)/(47-68) 102/53 (06/12 0300) SpO2:  [97 %-100 %] 99 % (06/12 0300) Weight:  [105.7 kg] 105.7 kg (06/11 1615) Temp (24hrs), Avg:97.8 F (36.6 C), Min:97.5 F (36.4 C), Max:98.3 F (36.8 C)   Intake/Output Summary (Last 24 hours) at 12/07/2020 0725 Last data filed at 12/07/2020 9678 Gross per 24 hour  Intake 1994.96 ml  Output 3400 ml  Net -1405.04 ml     Current Vital Signs 24h Vital Sign Ranges  T 98 F (36.7 C) Temp  Avg: 97.8 F (36.6 C)  Min: 97.5 F (36.4 C)  Max: 98.3 F (36.8 C)  BP (!) 102/53  BP  Min: 102/53  Max: 115/66  HR 78 Pulse  Avg: 81.8  Min: 66  Max: 96  RR 16 Resp  Avg: 17.3  Min: 16  Max: 19  SaO2 99 % Room Air SpO2  Avg: 99.7 %  Min: 97 %  Max: 100 %       24 Hour I/O Current Shift I/O  Time Ins Outs 06/11 0701 - 06/12 0700 In: 1995 [P.O.:870; I.V.:1125] Out: 3400 [Urine:3400] No intake/output data recorded.   Patient Vitals for the past 24 hrs:  BP Temp Temp  src Pulse Resp SpO2 Height Weight  12/07/20 0300 (!) 102/53 98 F (36.7 C) Oral 78 16 99 % -- --  12/06/20 2300 112/63 -- -- 83 17 97 % -- --  12/06/20 2247 115/66 97.8 F (36.6 C) Oral 84 16 100 % -- --  12/06/20 2100 113/62 -- -- 95 17 100 % -- --  12/06/20 2000 (!) 105/59 -- -- 96 19 100 % -- --  12/06/20 1900 111/61 (!) 97.5 F (36.4 C) Oral 95 18 100 % -- --  12/06/20 1800 111/61 (!) 97.5 F (36.4 C) Oral 86 19 100 % -- --  12/06/20 1738 (!) 109/47 -- -- 80 17 100 % -- --  12/06/20 1700 (!) 106/57 -- -- 68 -- -- -- --  12/06/20 1615 -- -- -- -- -- -- 5\' 6"  (1.676 m) 105.7 kg  12/06/20 1600 (!) 107/58 -- -- 66 -- 100 % -- --  12/06/20 1554 -- -- -- -- -- 100 % -- --  12/06/20 1549 -- -- -- -- -- 100 % -- --  12/06/20 1544 -- -- -- -- -- 100 % -- --  12/06/20 1539 -- -- -- -- -- 100 % -- --  12/06/20 1536 (!) 111/59 -- -- 68 17 -- -- --  12/06/20 1500 (!) 108/49 (!) 97.5 F (36.4 C) Oral 72 18 100 % -- --  12/06/20 1400 -- -- -- -- -- 99 % -- --  12/06/20 1230 112/68 98.3 F (36.8 C) Oral 92 16 100 % -- --    Physical exam: General: Well nourished, well developed female in no acute distress. Abdomen: gravid NT/ND Cardiovascular: regular rate and rhythm Respiratory: CTAB Extremities: no clubbing, cyanosis or edema Skin: Warm and dry.   Medications: Current Facility-Administered Medications  Medication Dose Route Frequency Provider Last Rate Last Admin   acetaminophen (TYLENOL) tablet 650 mg  650 mg Oral Q4H PRN Griffin Basil, MD       betamethasone acetate-betamethasone sodium phosphate (CELESTONE) injection 12 mg  12 mg Intramuscular Q24 Hr x 2 Len Blalock M, CNM   12 mg at 12/06/20 1349   calcium carbonate (TUMS - dosed in mg elemental calcium) chewable tablet 400 mg of elemental calcium  2 tablet Oral Q4H PRN Griffin Basil, MD       docusate sodium (COLACE) capsule 100 mg  100 mg Oral Daily Griffin Basil, MD       lactated ringers infusion   Intravenous  Continuous Griffin Basil, MD 10 mL/hr at 12/06/20 1507 New Bag at 12/06/20 1507   magnesium sulfate 40 grams in SWI 1000 mL OB infusion  2 g/hr Intravenous Titrated Griffin Basil, MD 50 mL/hr at 12/06/20 1536 2 g/hr at 12/06/20 1536   prenatal multivitamin tablet 1 tablet  1 tablet Oral Q1200 Griffin Basil, MD       zolpidem (AMBIEN) tablet 5 mg  5 mg Oral QHS PRN Griffin Basil, MD        Labs:  Recent Labs  Lab 12/06/20 1331  WBC 10.4  HGB 11.8*  HCT 38.1  PLT 348   NST: baseline 124, moderate variability, accels noted, no decels, cat 1 strip, reassuring but not reactive, rare ctx   Radiology:  Breech presentation, cervical funneling with dilation noted  Assessment & Plan:  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

## 2020-12-08 ENCOUNTER — Inpatient Hospital Stay (HOSPITAL_BASED_OUTPATIENT_CLINIC_OR_DEPARTMENT_OTHER): Payer: 59

## 2020-12-08 DIAGNOSIS — O329XX0 Maternal care for malpresentation of fetus, unspecified, not applicable or unspecified: Secondary | ICD-10-CM | POA: Diagnosis present

## 2020-12-08 DIAGNOSIS — O289 Unspecified abnormal findings on antenatal screening of mother: Secondary | ICD-10-CM

## 2020-12-08 DIAGNOSIS — O99213 Obesity complicating pregnancy, third trimester: Secondary | ICD-10-CM | POA: Diagnosis not present

## 2020-12-08 DIAGNOSIS — E669 Obesity, unspecified: Secondary | ICD-10-CM | POA: Diagnosis not present

## 2020-12-08 DIAGNOSIS — O0933 Supervision of pregnancy with insufficient antenatal care, third trimester: Secondary | ICD-10-CM

## 2020-12-08 DIAGNOSIS — O0930 Supervision of pregnancy with insufficient antenatal care, unspecified trimester: Secondary | ICD-10-CM

## 2020-12-08 DIAGNOSIS — O321XX0 Maternal care for breech presentation, not applicable or unspecified: Secondary | ICD-10-CM

## 2020-12-08 DIAGNOSIS — D259 Leiomyoma of uterus, unspecified: Secondary | ICD-10-CM | POA: Diagnosis present

## 2020-12-08 DIAGNOSIS — O3433 Maternal care for cervical incompetence, third trimester: Secondary | ICD-10-CM

## 2020-12-08 DIAGNOSIS — Z3A30 30 weeks gestation of pregnancy: Secondary | ICD-10-CM

## 2020-12-08 LAB — CULTURE, BETA STREP (GROUP B ONLY)

## 2020-12-08 LAB — GC/CHLAMYDIA PROBE AMP (~~LOC~~) NOT AT ARMC
Chlamydia: NEGATIVE
Comment: NEGATIVE
Comment: NORMAL
Neisseria Gonorrhea: NEGATIVE

## 2020-12-08 MED ORDER — LACTATED RINGERS IV BOLUS
1000.0000 mL | Freq: Once | INTRAVENOUS | Status: AC
  Administered 2020-12-08: 1000 mL via INTRAVENOUS

## 2020-12-08 NOTE — Progress Notes (Signed)
Daily Antepartum Note  Admission Date: 12/06/2020 Current Date: 12/08/2020 9:55 AM  Stacey Ochoa is a 35 y.o. G1 @ [redacted]w[redacted]d , HD#23 admitted for vaginal spotting and advanced cervical dilation with exposed membranes.  Pregnancy complicated by: Late prenatal care Patient Active Problem List   Diagnosis Date Noted   Malpresentation of fetus 12/08/2020   Late prenatal care 12/08/2020   Preterm labor in third trimester 12/06/2020   AMA (advanced maternal age) multigravida 77+, third trimester 12/01/2020   Encounter for supervision of normal first pregnancy in second trimester 11/17/2020    Overnight/24hr events:  none  Subjective:  No bleeding or spotting and no pain, leakage of fluid or decreased fetal movement  Objective:    Current Vital Signs 24h Vital Sign Ranges  T 98.2 F (36.8 C) Temp  Avg: 98.1 F (36.7 C)  Min: 97.7 F (36.5 C)  Max: 98.3 F (36.8 C)  BP 113/64  BP  Min: 108/59  Max: 132/63  HR (!) 57 Pulse  Avg: 69.2  Min: 57  Max: 78  RR 18 Resp  Avg: 17.2  Min: 16  Max: 19  SaO2 99 % Room Air SpO2  Avg: 98.5 %  Min: 98 %  Max: 99 %       24 Hour I/O Current Shift I/O  Time Ins Outs 06/12 0701 - 06/13 0700 In: 3416.3 [P.O.:1080; I.V.:1336.3] Out: 3040 [Urine:3040] No intake/output data recorded.   Fetal Heart Tones: 145 baseline, no accel or decel (? One slight variable), mod variability Tocometry: negative  Physical exam: General: Well nourished, well developed female in no acute distress. GU: deferred; 1cm visually with membranes seen on admission  Abdomen: gravid NT/ND Cardiovascular: regular rate and rhythm Respiratory: CTAB Extremities: no clubbing, cyanosis or edema Skin: Warm and dry.   Medications: Current Facility-Administered Medications  Medication Dose Route Frequency Provider Last Rate Last Admin   acetaminophen (TYLENOL) tablet 650 mg  650 mg Oral Q4H PRN Griffin Basil, MD       calcium carbonate (TUMS - dosed in mg elemental calcium)  chewable tablet 400 mg of elemental calcium  2 tablet Oral Q4H PRN Griffin Basil, MD       docusate sodium (COLACE) capsule 100 mg  100 mg Oral Daily Griffin Basil, MD   100 mg at 12/07/20 1049   lactated ringers infusion   Intravenous Continuous Griffin Basil, MD   Stopped at 12/07/20 1300   prenatal multivitamin tablet 1 tablet  1 tablet Oral Q1200 Griffin Basil, MD   1 tablet at 12/07/20 1049   zolpidem (AMBIEN) tablet 5 mg  5 mg Oral QHS PRN Griffin Basil, MD        Labs:  Pending GBS, GC/CT  Radiology:  No new imaging 6/13: Breech, BPP 8/8, AFI 22, normal UA dopplers 6/11: Breech, 11%, 1293gm, AC 32%, AFI 14. Multiple fibroids noted, descriptions not given.   Assessment & Plan:  Patient stable *Pregnancy: fetal status reassuring. Continue with qshift NSTs. Patient states the outside of hospital u/s she's had were at [facility] that gave her the gender and one at 23-24wks at [facility] that put her [place] at 02/16/21 which is c/w her 6/11 u/s. She states that her periods were around q28-32 days prior to getting pregnancy.  *Preterm: s/p BMZ on 6/11 and 6/12 and Mg on admission. S/p NICU consult *FEN/GI: regular diet, SLIV  *PPx: SCDs, OOB ad lib *Dispo: d/w her re: recommendation to  recheck cervix 1wk s/p initial exam.   Durene Romans MD Attending Center for [facility]) GYN Consult Phone: [phone] (M-F, 0800-1700) & [phone] (Off hours, weekends, holidays)

## 2020-12-09 DIAGNOSIS — Z3490 Encounter for supervision of normal pregnancy, unspecified, unspecified trimester: Secondary | ICD-10-CM

## 2020-12-09 DIAGNOSIS — Z3A30 30 weeks gestation of pregnancy: Secondary | ICD-10-CM

## 2020-12-09 NOTE — Plan of Care (Signed)
  Problem: Health Behavior/Discharge Planning: Goal: Ability to manage health-related needs will improve Outcome: Progressing   Problem: Clinical Measurements: Goal: Ability to maintain clinical measurements within normal limits will improve Outcome: Progressing Goal: Will remain free from infection Outcome: Progressing Goal: Diagnostic test results will improve Outcome: Progressing   Problem: Elimination: Goal: Will not experience complications related to bowel motility Outcome: Progressing   Problem: Pain Managment: Goal: General experience of comfort will improve Outcome: Progressing   Problem: Safety: Goal: Ability to remain free from injury will improve Outcome: Progressing   Problem: Skin Integrity: Goal: Risk for impaired skin integrity will decrease Outcome: Progressing   Problem: Clinical Measurements: Goal: Complications related to the disease process, condition or treatment will be avoided or minimized Outcome: Progressing

## 2020-12-09 NOTE — Progress Notes (Signed)
Daily Antepartum Note  Admission Date: 12/06/2020 Current Date: 12/09/2020 9:11 AM  Stacey Ochoa is a 35 y.o. G1 @ [redacted]w[redacted]d , HD#4 admitted for vaginal spotting and advanced cervical dilation with exposed membranes.  Pregnancy complicated by: Late prenatal care Patient Active Problem List   Diagnosis Date Noted   Malpresentation of fetus 12/08/2020   Late prenatal care 12/08/2020   Fibroid uterus 12/08/2020   Preterm labor in third trimester 12/06/2020   AMA (advanced maternal age) multigravida 35+, third trimester 12/01/2020   Encounter for supervision of normal first pregnancy in second trimester 11/17/2020    Overnight/24hr events:  none  Subjective:  No bleeding or spotting and no pain, leakage of fluid or decreased fetal movement  Objective:    Current Vital Signs 24h Vital Sign Ranges  T 98.4 F (36.9 C) Temp  Avg: 98.1 F (36.7 C)  Min: 97.4 F (36.3 C)  Max: 98.9 F (37.2 C)  BP (!) 112/52  BP  Min: 102/44  Max: 113/51  HR 61 Pulse  Avg: 66  Min: 57  Max: 79  RR 18 Resp  Avg: 17.3  Min: 16  Max: 18  SaO2 99 % Room Air SpO2  Avg: 99.8 %  Min: 99 %  Max: 100 %       24 Hour I/O Current Shift I/O  Time Ins Outs No intake/output data recorded. No intake/output data recorded.   Fetal Heart Tones: 145 baseline, +accels, no decel, mod variability Tocometry: negative x 50m  Physical exam: General: Well nourished, well developed female in no acute distress. GU: deferred; 1cm visually with membranes seen on admission  Abdomen: gravid NT/ND Cardiovascular: regular rate and rhythm Respiratory: CTAB Extremities: no clubbing, cyanosis or edema Skin: Warm and dry.   Medications: Current Facility-Administered Medications  Medication Dose Route Frequency Provider Last Rate Last Admin   acetaminophen (TYLENOL) tablet 650 mg  650 mg Oral Q4H PRN Griffin Basil, MD       calcium carbonate (TUMS - dosed in mg elemental calcium) chewable tablet 400 mg of elemental calcium   2 tablet Oral Q4H PRN Griffin Basil, MD       docusate sodium (COLACE) capsule 100 mg  100 mg Oral Daily Griffin Basil, MD   100 mg at 12/09/20 3846   lactated ringers infusion   Intravenous Continuous Griffin Basil, MD   Stopped at 12/07/20 1300   prenatal multivitamin tablet 1 tablet  1 tablet Oral Q1200 Griffin Basil, MD   1 tablet at 12/09/20 0908   zolpidem (AMBIEN) tablet 5 mg  5 mg Oral QHS PRN Griffin Basil, MD        Labs:  GBS and GC/CT: negative  Radiology:  No new imaging 6/13: Breech, BPP 8/8, AFI 22, normal UA dopplers 6/11: Breech, 11%, 1293gm, AC 32%, AFI 14. Multiple fibroids noted, descriptions not given.   Assessment & Plan:  Patient stable *Pregnancy: fetal status reassuring. Continue with qshift NSTs.  *Cx insufficiency: plan for repeat exam on friday -Last spotting: pink on 6/13 in the morning *Preterm: s/p BMZ on 6/11 and 6/12 and Mg on admission. S/p NICU consult *FEN/GI: regular diet, SLIV  *PPx: SCDs, OOB ad lib *Dispo: see above  Durene Romans MD Attending [facility] [place] [facility]) GYN Consult Phone: [phone] (M-F, 0800-1700) & [phone] (Off hours, weekends, holidays)

## 2020-12-09 NOTE — Plan of Care (Signed)
  Problem: Education: Goal: Knowledge of General Education information will improve Description: Including pain rating scale, medication(s)/side effects and non-pharmacologic comfort measures Outcome: Completed/Met   Problem: Activity: Goal: Risk for activity intolerance will decrease Outcome: Completed/Met   Problem: Nutrition: Goal: Adequate nutrition will be maintained Outcome: Completed/Met   Problem: Coping: Goal: Level of anxiety will decrease Outcome: Completed/Met   Problem: Elimination: Goal: Will not experience complications related to urinary retention Outcome: Completed/Met   Problem: Education: Goal: Knowledge of disease or condition will improve Outcome: Completed/Met Goal: Knowledge of the prescribed therapeutic regimen will improve Outcome: Completed/Met   Problem: Clinical Measurements: Goal: Respiratory complications will improve Outcome: Not Applicable Goal: Cardiovascular complication will be avoided Outcome: Not Applicable

## 2020-12-10 LAB — TYPE AND SCREEN
ABO/RH(D): O POS
Antibody Screen: NEGATIVE

## 2020-12-10 LAB — CBC
HCT: 34.3 % — ABNORMAL LOW (ref 36.0–46.0)
Hemoglobin: 10.9 g/dL — ABNORMAL LOW (ref 12.0–15.0)
MCH: 27.2 pg (ref 26.0–34.0)
MCHC: 31.8 g/dL (ref 30.0–36.0)
MCV: 85.5 fL (ref 80.0–100.0)
Platelets: 290 10*3/uL (ref 150–400)
RBC: 4.01 MIL/uL (ref 3.87–5.11)
RDW: 14.6 % (ref 11.5–15.5)
WBC: 12.8 10*3/uL — ABNORMAL HIGH (ref 4.0–10.5)
nRBC: 0 % (ref 0.0–0.2)

## 2020-12-10 MED ORDER — FERROUS SULFATE 325 (65 FE) MG PO TABS
325.0000 mg | ORAL_TABLET | ORAL | Status: DC
  Administered 2020-12-10: 325 mg via ORAL
  Filled 2020-12-10: qty 1

## 2020-12-10 NOTE — Progress Notes (Addendum)
Daily Antepartum Note  Admission Date: 12/06/2020 Current Date: 12/10/2020 12:06 PM  Stacey Ochoa is a 35 y.o. G1 @ [redacted]w[redacted]d , HD#5 admitted for vaginal spotting and advanced cervical dilation with exposed membranes.  Pregnancy complicated by: Late prenatal care Patient Active Problem List   Diagnosis Date Noted   Malpresentation of fetus 12/08/2020   Late prenatal care 12/08/2020   Fibroid uterus 12/08/2020   Preterm labor in third trimester 12/06/2020   AMA (advanced maternal age) multigravida 39+, third trimester 12/01/2020   Encounter for supervision of normal first pregnancy in second trimester 11/17/2020    Overnight/24hr events:  none  Subjective:  No bleeding and no pain, leakage of fluid or decreased fetal movement Still with spotting with wiping  Objective:    Current Vital Signs 24h Vital Sign Ranges  T 98.6 F (37 C) Temp  Avg: 98.2 F (36.8 C)  Min: 97.6 F (36.4 C)  Max: 98.9 F (37.2 C)  BP 122/73  BP  Min: 107/52  Max: 129/65  HR 95 Pulse  Avg: 73.6  Min: 59  Max: 95  RR 17 Resp  Avg: 17  Min: 16  Max: 18  SaO2 100 %  (Room Air) SpO2  Avg: 99.6 %  Min: 99 %  Max: 100 %       24 Hour I/O Current Shift I/O  Time Ins Outs No intake/output data recorded. No intake/output data recorded.   Fetal Heart Tones: 145 baseline, +accels, no decel, mod variability Tocometry: q4-5 x 12m  Physical exam: General: Well nourished, well developed female in no acute distress. GU: deferred; 1cm visually with membranes seen on admission  Abdomen: gravid NT/ND Cardiovascular: regular rate and rhythm Respiratory: CTAB Extremities: no clubbing, cyanosis or edema Skin: Warm and dry.   Medications: Current Facility-Administered Medications  Medication Dose Route Frequency Provider Last Rate Last Admin   acetaminophen (TYLENOL) tablet 650 mg  650 mg Oral Q4H PRN Griffin Basil, MD       calcium carbonate (TUMS - dosed in mg elemental calcium) chewable tablet 400 mg of  elemental calcium  2 tablet Oral Q4H PRN Griffin Basil, MD       docusate sodium (COLACE) capsule 100 mg  100 mg Oral Daily Griffin Basil, MD   100 mg at 12/10/20 0827   ferrous sulfate tablet 325 mg  325 mg Oral Fayne Norrie, MD   325 mg at 12/10/20 0830   lactated ringers infusion   Intravenous Continuous Griffin Basil, MD   Stopped at 12/07/20 1300   prenatal multivitamin tablet 1 tablet  1 tablet Oral Q1200 Griffin Basil, MD   1 tablet at 12/10/20 0827   zolpidem (AMBIEN) tablet 5 mg  5 mg Oral QHS PRN Griffin Basil, MD        Labs:  GBS and GC/CT: negative  Radiology:  No new imaging 6/13: Breech, BPP 8/8, AFI 22, normal UA dopplers 6/11: Breech, 11%, 1293gm, AC 32%, AFI 14. Multiple fibroids noted, descriptions not given.   Assessment & Plan:  Patient stable *Pregnancy: fetal status reassuring. Continue with qshift NSTs.  *Cx insufficiency: patient at high risk for preterm labor and further cervical dilation. Recommend continued inpatient examination with plan to repeat exam on 6/17. If stable and patient continuing to feel well, then should be fine for d/c to home.  -Last spotting: pink on 6/13 in the morning *Preterm: s/p BMZ on 6/11 and 6/12 and Mg on admission. S/p NICU  consult *FEN/GI: regular diet, SLIV  *PPx: SCDs, OOB ad lib *Dispo: see above  Durene Romans MD Attending [facility] for [facility] ([facility]) GYN Consult Phone: [phone] (M-F, 0800-1700) & [phone] (Off hours, weekends, holidays)

## 2020-12-11 ENCOUNTER — Encounter: Payer: 59 | Admitting: Obstetrics and Gynecology

## 2020-12-11 ENCOUNTER — Encounter (HOSPITAL_COMMUNITY): Payer: Self-pay | Admitting: Obstetrics and Gynecology

## 2020-12-11 LAB — CBC WITH DIFFERENTIAL/PLATELET
Abs Immature Granulocytes: 0.07 10*3/uL (ref 0.00–0.07)
Basophils Absolute: 0 10*3/uL (ref 0.0–0.1)
Basophils Relative: 0 %
Eosinophils Absolute: 0.2 10*3/uL (ref 0.0–0.5)
Eosinophils Relative: 1 %
HCT: 34.7 % — ABNORMAL LOW (ref 36.0–46.0)
Hemoglobin: 11 g/dL — ABNORMAL LOW (ref 12.0–15.0)
Immature Granulocytes: 1 %
Lymphocytes Relative: 16 %
Lymphs Abs: 2.3 10*3/uL (ref 0.7–4.0)
MCH: 26.9 pg (ref 26.0–34.0)
MCHC: 31.7 g/dL (ref 30.0–36.0)
MCV: 84.8 fL (ref 80.0–100.0)
Monocytes Absolute: 1 10*3/uL (ref 0.1–1.0)
Monocytes Relative: 7 %
Neutro Abs: 10.5 10*3/uL — ABNORMAL HIGH (ref 1.7–7.7)
Neutrophils Relative %: 75 %
Platelets: 325 10*3/uL (ref 150–400)
RBC: 4.09 MIL/uL (ref 3.87–5.11)
RDW: 14.5 % (ref 11.5–15.5)
WBC: 14.1 10*3/uL — ABNORMAL HIGH (ref 4.0–10.5)
nRBC: 0 % (ref 0.0–0.2)

## 2020-12-11 LAB — RPR: RPR Ser Ql: NONREACTIVE

## 2020-12-11 MED ORDER — ONDANSETRON HCL 4 MG/2ML IJ SOLN: 4.0000 mg | Freq: Four times a day (QID) | INTRAMUSCULAR | Status: DC | PRN

## 2020-12-11 MED ORDER — OXYTOCIN-SODIUM CHLORIDE 30-0.9 UT/500ML-% IV SOLN: 2.5000 [IU]/h | INTRAVENOUS | Status: DC

## 2020-12-11 MED ORDER — MAGNESIUM SULFATE BOLUS VIA INFUSION
6.0000 g | Freq: Once | INTRAVENOUS | Status: AC
  Administered 2020-12-11: 6 g via INTRAVENOUS
  Filled 2020-12-11: qty 1000

## 2020-12-11 MED ORDER — DOCUSATE SODIUM 100 MG PO CAPS: 100.0000 mg | ORAL_CAPSULE | Freq: Every day | ORAL | Status: DC

## 2020-12-11 MED ORDER — LACTATED RINGERS IV SOLN: 500.0000 mL | INTRAVENOUS | Status: DC | PRN

## 2020-12-11 MED ORDER — PRENATAL MULTIVITAMIN CH: 1.0000 | ORAL_TABLET | Freq: Every day | ORAL | Status: DC

## 2020-12-11 MED ORDER — FENTANYL CITRATE (PF) 100 MCG/2ML IJ SOLN: 100.0000 ug | INTRAMUSCULAR | Status: DC | PRN

## 2020-12-11 MED ORDER — SOD CITRATE-CITRIC ACID 500-334 MG/5ML PO SOLN: 30.0000 mL | ORAL | Status: DC | PRN

## 2020-12-11 MED ORDER — LACTATED RINGERS IV SOLN: INTRAVENOUS | Status: DC

## 2020-12-11 MED ORDER — ACETAMINOPHEN 325 MG PO TABS: 650.0000 mg | ORAL_TABLET | ORAL | Status: DC | PRN

## 2020-12-11 MED ORDER — OXYCODONE-ACETAMINOPHEN 5-325 MG PO TABS: 2.0000 | ORAL_TABLET | ORAL | Status: DC | PRN

## 2020-12-11 MED ORDER — OXYCODONE-ACETAMINOPHEN 5-325 MG PO TABS: 1.0000 | ORAL_TABLET | ORAL | Status: DC | PRN

## 2020-12-11 MED ORDER — PENICILLIN G POT IN DEXTROSE 60000 UNIT/ML IV SOLN
3.0000 10*6.[IU] | INTRAVENOUS | Status: DC
  Administered 2020-12-11 (×2): 3 10*6.[IU] via INTRAVENOUS
  Filled 2020-12-11 (×2): qty 50

## 2020-12-11 MED ORDER — LIDOCAINE HCL (PF) 1 % IJ SOLN: 30.0000 mL | INTRAMUSCULAR | Status: DC | PRN

## 2020-12-11 MED ORDER — MAGNESIUM SULFATE 40 GM/1000ML IV SOLN
2.0000 g/h | INTRAVENOUS | Status: DC
  Administered 2020-12-11: 2 g/h via INTRAVENOUS
  Filled 2020-12-11: qty 1000

## 2020-12-11 MED ORDER — OXYTOCIN BOLUS FROM INFUSION: 333.0000 mL | Freq: Once | INTRAVENOUS | Status: DC

## 2020-12-11 MED ORDER — SODIUM CHLORIDE 0.9 % IV SOLN
5.0000 10*6.[IU] | Freq: Once | INTRAVENOUS | Status: AC
  Administered 2020-12-11: 5 10*6.[IU] via INTRAVENOUS
  Filled 2020-12-11: qty 5

## 2020-12-11 NOTE — Progress Notes (Signed)
Labor Progress Note Stacey Ochoa is a 35 y.o. G1P0000 at [redacted]w[redacted]d presented on /11 for concern of vaginal bleeding and preterm labor. Pt transferred to L&D in the AM on 6/16 given painful contractions and cervical change to 3cm. Fetal position confirmed cephalic   S: Pt resting comfortably all day s/p initiation of neuroprotective magnesium. Pt also ate a small lunch but minimal po intake this afternoon. No concerns at this time.  O:  BP 119/71   Pulse 76   Temp 98.1 F (36.7 C) (Oral)   Resp 18   Ht 5\' 6"  (1.676 m)   Wt 104.9 kg   LMP  (LMP Unknown) Comment: LMP sometime in November  SpO2 99%   BMI 37.32 kg/m  EFM: baseline 135/moderate variability/no accels/intermittent variable decels appropriate for GA  CVE: Dilation: 3 Effacement (%): 100 Presentation: Vertex Exam by:: Dr. Astrid Drafts   A&P: 35 y.o. G1P0000 [redacted]w[redacted]d presented on /11 for concern of vaginal bleeding and preterm labor. Pt transferred to L&D in the AM on 6/16 given painful contractions and cervical change to 3cm. Fetal position confirmed cephalic on arrival to L&D on 6/16. # Preterm Labor: Quiet toco since initiation of magnesium. Unchanged cervical exam at 2100 on 6/16 and confirmed cephalic on bedside ultrasound. If pt remains stable, will consider transfer back to Antepartum overnight. - s/p NICU consult - s/p BMZ on 6/11, 6/12 - s/p neuroprotective magnesium (0700-1930 on 6/16)  #Pain: none #FWB: Category 2 strip given intermittent variable decels but appropriate for GA #GBS positive; continued on PCN since transfer to L&D.  Randa Ngo, MD 9:12 PM

## 2020-12-11 NOTE — Progress Notes (Signed)
Power out at 1556 and generator did not come on for 3 minutes. lee

## 2020-12-11 NOTE — Progress Notes (Signed)
    Faculty Practice OB/GYN Attending Note  Subjective:  Called to evaluate patient with small amount of bleeding and small clot. Had some BRB discovered while wiping yesterday.  Also reports cramping currently.  No current LOF or BRB per vagina.  Good FM.   Admitted on 12/06/2020 for Preterm labor in third trimester.    Objective:  Blood pressure (!) 99/55, pulse 93, temperature 98.1 F (36.7 C), temperature source Oral, resp. rate 18, height 5\' 6"  (1.676 m), weight 104.9 kg, SpO2 99 %. FHT  Baseline 150 bpm, moderate variability, no accelerations, variable decelerations Toco: q3-4 min Gen: NAD but anxious HENT: Normocephalic, atraumatic Lungs: Normal respiratory effort Heart: Regular rate noted Ext: 2+ DTRs, no edema, no cyanosis, negative Homan's sign Abdomen: NT gravid fundus, soft Cervix: Bloody show seen on speculum exam, BBOW noted.  On sterile difital exam, 3/100/BBOW.  Bedside scan confirmed vertex presentation.   Assessment & Plan:  35 y.o. G1P0000 at [redacted]w[redacted]d admitted for PTL, now with concerns for progressing PTL. - Will start magnesium sulfate for neuroprophylaxis and tocolysis; has received BMZ 6/11/-6/12. - Will transfer to L&D. Charge RN notified. - Neonatologist (Dr. Tamala Julian) also notified - Patient is NPO for now, she is aware of possible need for cesarean delivery if fetal presentation changes or for other indications. Reassured by current cephalic presentation.  - Continue close observation.   Verita Schneiders, MD, [place] for [employer], [place]

## 2020-12-12 ENCOUNTER — Encounter (HOSPITAL_COMMUNITY)
Admission: AD | Disposition: A | Discharge status: Home / Self Care | Payer: Self-pay | Source: Home / Self Care | Attending: Obstetrics and Gynecology

## 2020-12-12 ENCOUNTER — Inpatient Hospital Stay (HOSPITAL_COMMUNITY): Payer: 59 | Admitting: Anesthesiology

## 2020-12-12 ENCOUNTER — Encounter (HOSPITAL_COMMUNITY): Payer: Self-pay | Admitting: Obstetrics and Gynecology

## 2020-12-12 DIAGNOSIS — O3430 Maternal care for cervical incompetence, unspecified trimester: Secondary | ICD-10-CM | POA: Diagnosis present

## 2020-12-12 DIAGNOSIS — Z3A30 30 weeks gestation of pregnancy: Secondary | ICD-10-CM

## 2020-12-12 LAB — CBC
HCT: 32.9 % — ABNORMAL LOW (ref 36.0–46.0)
Hemoglobin: 10.8 g/dL — ABNORMAL LOW (ref 12.0–15.0)
MCH: 27.5 pg (ref 26.0–34.0)
MCHC: 32.8 g/dL (ref 30.0–36.0)
MCV: 83.7 fL (ref 80.0–100.0)
Platelets: 300 10*3/uL (ref 150–400)
RBC: 3.93 MIL/uL (ref 3.87–5.11)
RDW: 14.5 % (ref 11.5–15.5)
WBC: 14.9 10*3/uL — ABNORMAL HIGH (ref 4.0–10.5)
nRBC: 0 % (ref 0.0–0.2)

## 2020-12-12 SURGERY — Surgical Case
Anesthesia: Spinal

## 2020-12-12 MED ORDER — OXYTOCIN-SODIUM CHLORIDE 30-0.9 UT/500ML-% IV SOLN: 2.5000 [IU]/h | INTRAVENOUS | Status: AC

## 2020-12-12 MED ORDER — OXYTOCIN-SODIUM CHLORIDE 30-0.9 UT/500ML-% IV SOLN
INTRAVENOUS | Status: AC
  Filled 2020-12-12: qty 500

## 2020-12-12 MED ORDER — ACETAMINOPHEN 500 MG PO TABS: 1000.0000 mg | ORAL_TABLET | Freq: Once | ORAL | Status: DC

## 2020-12-12 MED ORDER — ENOXAPARIN SODIUM 60 MG/0.6ML IJ SOSY
50.0000 mg | PREFILLED_SYRINGE | INTRAMUSCULAR | Status: DC
  Administered 2020-12-12 – 2020-12-14 (×3): 50 mg via SUBCUTANEOUS
  Filled 2020-12-12 (×3): qty 0.6

## 2020-12-12 MED ORDER — NALOXONE HCL 4 MG/10ML IJ SOLN
1.0000 ug/kg/h | INTRAVENOUS | Status: DC | PRN
  Filled 2020-12-12: qty 5

## 2020-12-12 MED ORDER — ONDANSETRON HCL 4 MG/2ML IJ SOLN
INTRAMUSCULAR | Status: DC | PRN
  Administered 2020-12-12: 4 mg via INTRAVENOUS

## 2020-12-12 MED ORDER — MORPHINE SULFATE (PF) 0.5 MG/ML IJ SOLN
INTRAMUSCULAR | Status: AC
  Filled 2020-12-12: qty 10

## 2020-12-12 MED ORDER — PHENYLEPHRINE HCL-NACL 20-0.9 MG/250ML-% IV SOLN
INTRAVENOUS | Status: AC
  Filled 2020-12-12: qty 250

## 2020-12-12 MED ORDER — ONDANSETRON HCL 4 MG/2ML IJ SOLN
INTRAMUSCULAR | Status: AC
  Filled 2020-12-12: qty 2

## 2020-12-12 MED ORDER — MENTHOL 3 MG MT LOZG: 1.0000 | LOZENGE | OROMUCOSAL | Status: DC | PRN

## 2020-12-12 MED ORDER — DEXMEDETOMIDINE (PRECEDEX) IN NS 20 MCG/5ML (4 MCG/ML) IV SYRINGE
PREFILLED_SYRINGE | INTRAVENOUS | Status: DC | PRN
  Administered 2020-12-12 (×4): 4 ug via INTRAVENOUS

## 2020-12-12 MED ORDER — NALBUPHINE HCL 10 MG/ML IJ SOLN: 5.0000 mg | Freq: Once | INTRAMUSCULAR | Status: DC | PRN

## 2020-12-12 MED ORDER — BUPIVACAINE IN DEXTROSE 0.75-8.25 % IT SOLN
INTRATHECAL | Status: DC | PRN
  Administered 2020-12-12: 1.6 mL via INTRATHECAL

## 2020-12-12 MED ORDER — COCONUT OIL OIL
1.0000 "application " | TOPICAL_OIL | Status: DC | PRN
  Administered 2020-12-13: 1 via TOPICAL

## 2020-12-12 MED ORDER — FENTANYL CITRATE (PF) 100 MCG/2ML IJ SOLN
INTRAMUSCULAR | Status: AC
  Filled 2020-12-12: qty 2

## 2020-12-12 MED ORDER — PHENYLEPHRINE 40 MCG/ML (10ML) SYRINGE FOR IV PUSH (FOR BLOOD PRESSURE SUPPORT)
PREFILLED_SYRINGE | INTRAVENOUS | Status: AC
  Filled 2020-12-12: qty 10

## 2020-12-12 MED ORDER — DIPHENHYDRAMINE HCL 25 MG PO CAPS: 25.0000 mg | ORAL_CAPSULE | Freq: Four times a day (QID) | ORAL | Status: DC | PRN

## 2020-12-12 MED ORDER — ZOLPIDEM TARTRATE 5 MG PO TABS: 5.0000 mg | ORAL_TABLET | Freq: Every evening | ORAL | Status: DC | PRN

## 2020-12-12 MED ORDER — MORPHINE SULFATE (PF) 0.5 MG/ML IJ SOLN
INTRAMUSCULAR | Status: DC | PRN
  Administered 2020-12-12: 150 ug via INTRATHECAL

## 2020-12-12 MED ORDER — STERILE WATER FOR IRRIGATION IR SOLN
Status: DC | PRN
  Administered 2020-12-12: 1

## 2020-12-12 MED ORDER — POVIDONE-IODINE 10 % EX SWAB: 2.0000 "application " | Freq: Once | CUTANEOUS | Status: DC

## 2020-12-12 MED ORDER — KETOROLAC TROMETHAMINE 30 MG/ML IJ SOLN: 30.0000 mg | Freq: Once | INTRAMUSCULAR | Status: DC

## 2020-12-12 MED ORDER — CEFAZOLIN SODIUM-DEXTROSE 2-4 GM/100ML-% IV SOLN
2.0000 g | INTRAVENOUS | Status: AC
Start: 2020-12-12 — End: 2020-12-12
  Administered 2020-12-12: 2 g via INTRAVENOUS
  Filled 2020-12-12: qty 100

## 2020-12-12 MED ORDER — NALBUPHINE HCL 10 MG/ML IJ SOLN: 5.0000 mg | INTRAMUSCULAR | Status: DC | PRN

## 2020-12-12 MED ORDER — ONDANSETRON HCL 4 MG/2ML IJ SOLN
4.0000 mg | Freq: Three times a day (TID) | INTRAMUSCULAR | Status: DC | PRN
  Administered 2020-12-12: 4 mg via INTRAVENOUS
  Filled 2020-12-12: qty 2

## 2020-12-12 MED ORDER — KETOROLAC TROMETHAMINE 30 MG/ML IJ SOLN: 30.0000 mg | Freq: Four times a day (QID) | INTRAMUSCULAR | Status: DC | PRN

## 2020-12-12 MED ORDER — NALOXONE HCL 0.4 MG/ML IJ SOLN: 0.4000 mg | INTRAMUSCULAR | Status: DC | PRN

## 2020-12-12 MED ORDER — FERROUS SULFATE 325 (65 FE) MG PO TABS
325.0000 mg | ORAL_TABLET | ORAL | Status: DC
  Administered 2020-12-14: 325 mg via ORAL
  Filled 2020-12-12: qty 1

## 2020-12-12 MED ORDER — PRENATAL MULTIVITAMIN CH
1.0000 | ORAL_TABLET | Freq: Every day | ORAL | Status: DC
  Administered 2020-12-13 – 2020-12-14 (×2): 1 via ORAL
  Filled 2020-12-12 (×2): qty 1

## 2020-12-12 MED ORDER — DEXAMETHASONE SODIUM PHOSPHATE 10 MG/ML IJ SOLN
INTRAMUSCULAR | Status: DC | PRN
  Administered 2020-12-12: 10 mg via INTRAVENOUS

## 2020-12-12 MED ORDER — SIMETHICONE 80 MG PO CHEW
80.0000 mg | CHEWABLE_TABLET | Freq: Three times a day (TID) | ORAL | Status: DC
  Administered 2020-12-12 – 2020-12-15 (×8): 80 mg via ORAL
  Filled 2020-12-12 (×8): qty 1

## 2020-12-12 MED ORDER — WITCH HAZEL-GLYCERIN EX PADS: 1.0000 "application " | MEDICATED_PAD | CUTANEOUS | Status: DC | PRN

## 2020-12-12 MED ORDER — DIPHENHYDRAMINE HCL 50 MG/ML IJ SOLN: 12.5000 mg | Freq: Four times a day (QID) | INTRAMUSCULAR | Status: DC | PRN

## 2020-12-12 MED ORDER — PROMETHAZINE HCL 25 MG/ML IJ SOLN
INTRAMUSCULAR | Status: AC
  Filled 2020-12-12: qty 1

## 2020-12-12 MED ORDER — DIBUCAINE (PERIANAL) 1 % EX OINT: 1.0000 "application " | TOPICAL_OINTMENT | CUTANEOUS | Status: DC | PRN

## 2020-12-12 MED ORDER — PHENYLEPHRINE HCL (PRESSORS) 10 MG/ML IV SOLN
INTRAVENOUS | Status: DC | PRN
  Administered 2020-12-12: 80 ug via INTRAVENOUS
  Administered 2020-12-12: 120 ug via INTRAVENOUS
  Administered 2020-12-12: 200 ug via INTRAVENOUS

## 2020-12-12 MED ORDER — DEXAMETHASONE SODIUM PHOSPHATE 10 MG/ML IJ SOLN
INTRAMUSCULAR | Status: AC
  Filled 2020-12-12: qty 1

## 2020-12-12 MED ORDER — ACETAMINOPHEN 500 MG PO TABS
1000.0000 mg | ORAL_TABLET | Freq: Four times a day (QID) | ORAL | Status: AC
  Administered 2020-12-12 – 2020-12-13 (×2): 1000 mg via ORAL
  Filled 2020-12-12 (×2): qty 2

## 2020-12-12 MED ORDER — PHENYLEPHRINE HCL-NACL 20-0.9 MG/250ML-% IV SOLN
INTRAVENOUS | Status: DC | PRN
  Administered 2020-12-12: 60 ug/min via INTRAVENOUS

## 2020-12-12 MED ORDER — OXYCODONE HCL 5 MG PO TABS
5.0000 mg | ORAL_TABLET | ORAL | Status: DC | PRN
  Administered 2020-12-13 – 2020-12-14 (×4): 5 mg via ORAL
  Filled 2020-12-12 (×4): qty 1

## 2020-12-12 MED ORDER — KETOROLAC TROMETHAMINE 30 MG/ML IJ SOLN
30.0000 mg | Freq: Four times a day (QID) | INTRAMUSCULAR | Status: AC
  Administered 2020-12-12 (×3): 30 mg via INTRAVENOUS
  Filled 2020-12-12 (×4): qty 1

## 2020-12-12 MED ORDER — SODIUM CHLORIDE 0.9% FLUSH: 3.0000 mL | INTRAVENOUS | Status: DC | PRN

## 2020-12-12 MED ORDER — IBUPROFEN 600 MG PO TABS
600.0000 mg | ORAL_TABLET | Freq: Four times a day (QID) | ORAL | Status: DC
  Administered 2020-12-13 – 2020-12-15 (×9): 600 mg via ORAL
  Filled 2020-12-12 (×10): qty 1

## 2020-12-12 MED ORDER — OXYTOCIN-SODIUM CHLORIDE 30-0.9 UT/500ML-% IV SOLN
INTRAVENOUS | Status: DC | PRN
  Administered 2020-12-12: 300 mL via INTRAVENOUS

## 2020-12-12 MED ORDER — SIMETHICONE 80 MG PO CHEW
80.0000 mg | CHEWABLE_TABLET | ORAL | Status: DC | PRN
  Administered 2020-12-13: 80 mg via ORAL
  Filled 2020-12-12: qty 1

## 2020-12-12 MED ORDER — DEXMEDETOMIDINE (PRECEDEX) IN NS 20 MCG/5ML (4 MCG/ML) IV SYRINGE
PREFILLED_SYRINGE | INTRAVENOUS | Status: AC
  Filled 2020-12-12: qty 5

## 2020-12-12 MED ORDER — SODIUM CHLORIDE 0.9 % IR SOLN
Status: DC | PRN
  Administered 2020-12-12: 1

## 2020-12-12 MED ORDER — SENNOSIDES-DOCUSATE SODIUM 8.6-50 MG PO TABS
2.0000 | ORAL_TABLET | Freq: Every day | ORAL | Status: DC
  Administered 2020-12-13 – 2020-12-15 (×3): 2 via ORAL
  Filled 2020-12-12 (×3): qty 2

## 2020-12-12 MED ORDER — SOD CITRATE-CITRIC ACID 500-334 MG/5ML PO SOLN
ORAL | Status: AC
  Administered 2020-12-12: 30 mL via ORAL
  Filled 2020-12-12: qty 15

## 2020-12-12 MED ORDER — LACTATED RINGERS IV SOLN: INTRAVENOUS | Status: DC

## 2020-12-12 MED ORDER — TETANUS-DIPHTH-ACELL PERTUSSIS 5-2.5-18.5 LF-MCG/0.5 IM SUSY
0.5000 mL | PREFILLED_SYRINGE | Freq: Once | INTRAMUSCULAR | Status: AC
  Administered 2020-12-14: 0.5 mL via INTRAMUSCULAR
  Filled 2020-12-12: qty 0.5

## 2020-12-12 MED ORDER — ACETAMINOPHEN 160 MG/5ML PO SOLN: 1000.0000 mg | Freq: Once | ORAL | Status: DC

## 2020-12-12 MED ORDER — FENTANYL CITRATE (PF) 100 MCG/2ML IJ SOLN
INTRAMUSCULAR | Status: DC | PRN
  Administered 2020-12-12: 15 ug via INTRATHECAL

## 2020-12-12 MED ORDER — FENTANYL CITRATE (PF) 100 MCG/2ML IJ SOLN
25.0000 ug | INTRAMUSCULAR | Status: DC | PRN
  Administered 2020-12-12: 25 ug via INTRAVENOUS

## 2020-12-12 MED ORDER — PROMETHAZINE HCL 25 MG/ML IJ SOLN
6.2500 mg | INTRAMUSCULAR | Status: DC | PRN
  Administered 2020-12-12: 6.25 mg via INTRAVENOUS

## 2020-12-12 SURGICAL SUPPLY — 37 items
BENZOIN TINCTURE PRP APPL 2/3 (GAUZE/BANDAGES/DRESSINGS) ×2 IMPLANT
CHLORAPREP W/TINT 26ML (MISCELLANEOUS) ×2 IMPLANT
CLAMP CORD UMBIL (MISCELLANEOUS) IMPLANT
CLOSURE STERI STRIP 1/2 X4 (GAUZE/BANDAGES/DRESSINGS) ×2 IMPLANT
CLOTH BEACON ORANGE TIMEOUT ST (SAFETY) ×2 IMPLANT
DERMABOND ADVANCED (GAUZE/BANDAGES/DRESSINGS)
DERMABOND ADVANCED .7 DNX12 (GAUZE/BANDAGES/DRESSINGS) IMPLANT
DRSG OPSITE POSTOP 4X10 (GAUZE/BANDAGES/DRESSINGS) ×2 IMPLANT
ELECT REM PT RETURN 9FT ADLT (ELECTROSURGICAL) ×2
ELECTRODE REM PT RTRN 9FT ADLT (ELECTROSURGICAL) ×1 IMPLANT
EXTRACTOR VACUUM KIWI (MISCELLANEOUS) IMPLANT
GLOVE BIOGEL PI IND STRL 7.0 (GLOVE) ×3 IMPLANT
GLOVE BIOGEL PI INDICATOR 7.0 (GLOVE) ×3
GLOVE ECLIPSE 6.5 STRL STRAW (GLOVE) ×2 IMPLANT
GOWN STRL REUS W/TWL LRG LVL3 (GOWN DISPOSABLE) ×6 IMPLANT
HEMOSTAT ARISTA ABSORB 3G PWDR (HEMOSTASIS) ×2 IMPLANT
KIT ABG SYR 3ML LUER SLIP (SYRINGE) IMPLANT
NEEDLE HYPO 25X5/8 SAFETYGLIDE (NEEDLE) IMPLANT
NS IRRIG 1000ML POUR BTL (IV SOLUTION) ×2 IMPLANT
PACK C SECTION WH (CUSTOM PROCEDURE TRAY) ×2 IMPLANT
PAD ABD 7.5X8 STRL (GAUZE/BANDAGES/DRESSINGS) ×2 IMPLANT
PAD OB MATERNITY 4.3X12.25 (PERSONAL CARE ITEMS) ×2 IMPLANT
PENCIL SMOKE EVAC W/HOLSTER (ELECTROSURGICAL) ×2 IMPLANT
RTRCTR C-SECT PINK 25CM LRG (MISCELLANEOUS) ×2 IMPLANT
STRIP CLOSURE SKIN 1/2X4 (GAUZE/BANDAGES/DRESSINGS) ×2 IMPLANT
SUT PLAIN 0 NONE (SUTURE) IMPLANT
SUT PLAIN 2 0 XLH (SUTURE) ×2 IMPLANT
SUT VIC AB 0 CT1 27 (SUTURE) ×2
SUT VIC AB 0 CT1 27XBRD ANBCTR (SUTURE) ×2 IMPLANT
SUT VIC AB 0 CTX 36 (SUTURE) ×3
SUT VIC AB 0 CTX36XBRD ANBCTRL (SUTURE) ×3 IMPLANT
SUT VIC AB 2-0 CT1 27 (SUTURE) ×1
SUT VIC AB 2-0 CT1 TAPERPNT 27 (SUTURE) ×1 IMPLANT
SUT VIC AB 4-0 KS 27 (SUTURE) ×2 IMPLANT
TOWEL OR 17X24 6PK STRL BLUE (TOWEL DISPOSABLE) ×2 IMPLANT
TRAY FOLEY W/BAG SLVR 14FR LF (SET/KITS/TRAYS/PACK) IMPLANT
WATER STERILE IRR 1000ML POUR (IV SOLUTION) ×2 IMPLANT

## 2020-12-12 NOTE — Lactation Note (Signed)
This note was copied from a baby's chart. Lactation Consultation Note  Patient Name: Stacey Ochoa [id number] Date: 12/12/2020 Reason for consult: Initial assessment;NICU baby;Preterm <34wks;Other (Comment);Infant < 6lbs (AMA) Age:36 hours  Visited with mom of 13 hours old pre-term NICU, she reported (+) breast changes during the pregnancy. [place] set up DEBP, mom started pumping during [facility] consultation; praised her for her efforts.  She voiced to [facility] she didn't know she was pregnant until May and that she didn't have a chance to apply for the [facility] program but that she's planning on getting a DEBP. LC advised on a couple of good brands to get, mom unsure if she's eligible for [facility].  Reviewed hand expression (mom able to get colostrum out of both breasts), pumping schedule, lactogenesis II, and benefits of breastmilk for NICU babies.  Feeding plan:  Encouraged mom to start pumping every 2-3 hours, ideally 8 pumping sessions/24 hours Hand expression and breast massage were also encouraged prior pumping  BF brochure, BF resources and NICU booklet were reviewed. GOB (maternal) present and supportive. Family reported all questions and concerns were answered, they're both aware of [facility] OP services and will call PRN.  Maternal Data Has patient been taught Hand Expression?: Yes Does the patient have breastfeeding experience prior to this delivery?: No  Feeding Mother's Current Feeding Choice: Breast Milk  LATCH Score                    Lactation Tools Discussed/Used Tools: Pump;Flanges Breast pump type: Double-Electric Breast Pump Pump Education: Setup, frequency, and cleaning;Milk Storage Reason for Pumping: pre-term < 3 lbs in NICU Pumping frequency: q 3 hours  Interventions Interventions: Breast feeding basics reviewed;Breast massage;Hand express;DEBP;Education  Discharge Pump: DEBP WIC Program: No  Consult Status Consult Status: Follow-up Date: 12/13/20 Follow-up type:  In-patient    Janaki Exley Francene Boyers 12/12/2020, 6:56 PM

## 2020-12-12 NOTE — Anesthesia Procedure Notes (Signed)
Spinal  Patient location during procedure: OR Start time: 12/12/2020 3:18 AM End time: 12/12/2020 3:20 AM Reason for block: surgical anesthesia Staffing Performed: anesthesiologist  Anesthesiologist: Brennan Bailey, MD Preanesthetic Checklist Completed: patient identified, IV checked, risks and benefits discussed, monitors and equipment checked, pre-op evaluation and timeout performed Spinal Block Patient position: sitting Prep: DuraPrep and site prepped and draped Patient monitoring: heart rate, continuous pulse ox and blood pressure Approach: midline Location: L3-4 Injection technique: single-shot Needle Needle type: Pencan  Needle gauge: 24 G Needle length: 10 cm Assessment Sensory level: T4 Events: CSF return Additional Notes Risks, benefits, and alternative discussed. Patient gave consent to procedure. Prepped and draped in sitting position. Clear CSF obtained after one needle pass. Positive terminal aspiration. No pain or paraesthesias with injection. Patient tolerated procedure well. Vital signs stable. Tawny Asal, MD

## 2020-12-12 NOTE — Lactation Note (Signed)
This note was copied from a baby's chart. Lactation Consultation Note  Patient Name: Stacey Ochoa [id number] Date: 12/12/2020   Age:36 hours  OB specialty care RN Crystal reported to [facility] that mom is currently in the NICU visiting with baby. Mom has been nauseous and she hasn't had a change to be set up with a DEBP yet. LC to come back later to do initial assessment with mom and follow up on pumping status.   Maternal Data    Feeding    LATCH Score                    Lactation Tools Discussed/Used    Interventions    Discharge    Consult Status      Zuleima Haser Francene Boyers 12/12/2020, 3:34 PM

## 2020-12-12 NOTE — Anesthesia Preprocedure Evaluation (Signed)
Anesthesia Evaluation  Patient identified by MRN, date of birth, ID band Patient awake    Reviewed: Allergy & Precautions, NPO status , Patient's Chart, lab work & pertinent test results  History of Anesthesia Complications Negative for: history of anesthetic complications  Airway Mallampati: I  TM Distance: >3 FB Neck ROM: Full    Dental no notable dental hx.    Pulmonary former smoker,    Pulmonary exam normal        Cardiovascular negative cardio ROS Normal cardiovascular exam     Neuro/Psych negative neurological ROS  negative psych ROS   GI/Hepatic negative GI ROS, Neg liver ROS,   Endo/Other  negative endocrine ROS  Renal/GU negative Renal ROS  negative genitourinary   Musculoskeletal negative musculoskeletal ROS (+)   Abdominal   Peds  Hematology Hgb 10.8, plt 300   Anesthesia Other Findings Day of surgery medications reviewed with patient.  Reproductive/Obstetrics (+) Pregnancy                             Anesthesia Physical Anesthesia Plan  ASA: 3 and emergent  Anesthesia Plan: Spinal   Post-op Pain Management:    Induction:   PONV Risk Score and Plan: 4 or greater and Treatment may vary due to age or medical condition, Ondansetron and Dexamethasone  Airway Management Planned: Natural Airway  Additional Equipment: None  Intra-op Plan:   Post-operative Plan:   Informed Consent: I have reviewed the patients History and Physical, chart, labs and discussed the procedure including the risks, benefits and alternatives for the proposed anesthesia with the patient or authorized representative who has indicated his/her understanding and acceptance.       Plan Discussed with: CRNA  Anesthesia Plan Comments: (Urgent C/S for breech presentation with vaginal bleeding suspicious for placental abruption. Plan for spinal anesthetic. Daiva Huge, MD)        Anesthesia Quick  Evaluation

## 2020-12-12 NOTE — Plan of Care (Signed)
  Problem: Health Behavior/Discharge Planning: Goal: Ability to manage health-related needs will improve Outcome: Progressing   Problem: Clinical Measurements: Goal: Ability to maintain clinical measurements within normal limits will improve Outcome: Progressing Goal: Will remain free from infection Outcome: Progressing Goal: Diagnostic test results will improve Outcome: Progressing   Problem: Elimination: Goal: Will not experience complications related to bowel motility Outcome: Progressing   Problem: Pain Managment: Goal: General experience of comfort will improve Outcome: Progressing   Problem: Safety: Goal: Ability to remain free from injury will improve Outcome: Progressing   Problem: Skin Integrity: Goal: Risk for impaired skin integrity will decrease Outcome: Progressing   Problem: Education: Goal: Knowledge of condition will improve Outcome: Progressing Goal: Individualized Educational Video(s) Outcome: Progressing Goal: Individualized Newborn Educational Video(s) Outcome: Progressing   Problem: Activity: Goal: Will verbalize the importance of balancing activity with adequate rest periods Outcome: Progressing Goal: Ability to tolerate increased activity will improve Outcome: Progressing   Problem: Coping: Goal: Ability to identify and utilize available resources and services will improve Outcome: Progressing   Problem: Life Cycle: Goal: Chance of risk for complications during the postpartum period will decrease Outcome: Progressing   Problem: Role Relationship: Goal: Ability to demonstrate positive interaction with newborn will improve Outcome: Progressing   Problem: Skin Integrity: Goal: Demonstration of wound healing without infection will improve Outcome: Progressing   Problem: Education: Goal: Knowledge of General Education information will improve Description: Including pain rating scale, medication(s)/side effects and non-pharmacologic comfort  measures Outcome: Progressing   Problem: Health Behavior/Discharge Planning: Goal: Ability to manage health-related needs will improve Outcome: Progressing   Problem: Clinical Measurements: Goal: Ability to maintain clinical measurements within normal limits will improve Outcome: Progressing Goal: Will remain free from infection Outcome: Progressing Goal: Diagnostic test results will improve Outcome: Progressing Goal: Respiratory complications will improve Outcome: Progressing Goal: Cardiovascular complication will be avoided Outcome: Progressing   Problem: Activity: Goal: Risk for activity intolerance will decrease Outcome: Progressing   Problem: Nutrition: Goal: Adequate nutrition will be maintained Outcome: Progressing   Problem: Coping: Goal: Level of anxiety will decrease Outcome: Progressing   Problem: Elimination: Goal: Will not experience complications related to bowel motility Outcome: Progressing Goal: Will not experience complications related to urinary retention Outcome: Progressing   Problem: Pain Managment: Goal: General experience of comfort will improve Outcome: Progressing   Problem: Safety: Goal: Ability to remain free from injury will improve Outcome: Progressing   Problem: Skin Integrity: Goal: Risk for impaired skin integrity will decrease Outcome: Progressing

## 2020-12-12 NOTE — Progress Notes (Signed)
Called to bedside due to vaginal bleeding.  Pt reporting 8/10 pain.  Also noted BRB.  O: BP 110/67 (BP Location: Right Arm)   Pulse 79   Temp 98 F (36.7 C) (Oral)   Resp 18   Ht 5\' 6"  (1.676 m)   Wt 104.9 kg   LMP  (LMP Unknown) Comment: LMP sometime in November  SpO2 100%   BMI 37.32 kg/m    FHT: 140, moderate variability, +10x10 accels, no decels Toco: q56min SVE: 4/100/ blood clots noted in cervical os with no presenting fetal parts palpated  BSUS: breech presentation  Discussed plan to proceed with delivery due to concern for preterm labor and possible abruption.  Due to breech presentation, route of delivery via C-section.Risk benefits and alternatives of cesarean section were discussed with the patient including but not limited to infection, bleeding, damage to bowel , bladder and baby with the need for further surgery. Pt voiced understanding and desires to proceed.    Janyth Pupa, DO Attending [place], [facility] for [employer], [place]

## 2020-12-12 NOTE — Op Note (Signed)
PreOp Diagnosis: 1) Intrauterine pregnancy @ [redacted]w[redacted]d 2) Preterm labor 3) Breech presentation PostOp Diagnosis: same Procedure: Primary C-section Surgeon: Dr. Janyth Pupa Anesthesia: spinal Complications: none EBL: 285cc UOP: 350cc Fluids: 1000cc   Findings: Female infant from footling breech presentation.  Uterus with several small subserosal fibroids, normal tubes and ovaries bilaterally  PROCEDURE:  Informed consent was obtained from the patient with risks, benefits, complications, treatment options, and expected outcomes discussed with the patient.  The patient concurred with the proposed plan, giving informed consent with form signed.   The patient was taken to Operating Room, and identified with the procedure verified as C-Section Delivery with Time Out. With induction of anesthesia, the patient was prepped and draped in the usual sterile fashion. A Pfannenstiel incision was made and carried down through the subcutaneous tissue to the fascia. The fascia was incised in the midline and extended transversely. The superior aspect of the fascial incision was grasped with Kochers elevated and the underlying muscle dissected off. The inferior aspect of the facial incision was in similar fashion, grasped elevated and rectus muscles dissected off. The peritoneum was identified and entered. Peritoneal incision was extended longitudinally. Alexis retractor was placed.A low transverse uterine incision was made and extended with the bandage scissors.  Foot present at the hysterotomy which was grasped and delivered  The second foot and leg were then delivered.  A blue towel was placed over the infant's sacrum.  The infant was then rotated 180 degree to allow for deliver of each limb.  The head was flexed and delivered without difficulty.  After the umbilical cord was clamped and cut cord blood was obtained for evaluation.   The placenta was removed intact and appeared normal. The uterine outline, tubes and  ovaries were noted as above.  The uterine incision was closed with running locked sutures of 0 Vicryl and a second layer of the same stitch was used in an imbricating fashion.  Excellent hemostasis was obtained.  Arista was placed. The fascia was then reapproximated with running sutures of 0 Vicryl. The subcutaneous tissue was reapproximated with 2-0 plain gut suture.  The skin was closed with 4-0 vicryl in a subcuticular fashion.  Instrument, sponge, and needle counts were correct prior the abdominal closure and at the conclusion of the case. The patient was taken to recovery in stable condition.   Janyth Pupa, DO Attending [place], [facility] for [employer], [place]

## 2020-12-12 NOTE — Plan of Care (Signed)
  Problem: Clinical Measurements: Goal: Ability to maintain clinical measurements within normal limits will improve Outcome: Progressing   Problem: Pain Managment: Goal: General experience of comfort will improve Outcome: Progressing   Problem: Safety: Goal: Ability to remain free from injury will improve Outcome: Progressing   Problem: Skin Integrity: Goal: Risk for impaired skin integrity will decrease Outcome: Progressing   Problem: Activity: Goal: Risk for activity intolerance will decrease Outcome: Progressing   Problem: Coping: Goal: Level of anxiety will decrease Outcome: Progressing   Problem: Clinical Measurements: Goal: Complications related to the disease process, condition or treatment will be avoided or minimized Outcome: Progressing

## 2020-12-12 NOTE — Transfer of Care (Signed)
Immediate Anesthesia Transfer of Care Note  Patient: Stacey Ochoa  Procedure(s) Performed: CESAREAN SECTION  Patient Location: PACU  Anesthesia Type:Spinal  Level of Consciousness: awake, alert  and oriented  Airway & Oxygen Therapy: Patient Spontanous Breathing  Post-op Assessment: Report given to RN and Post -op Vital signs reviewed and stable  Post vital signs: Reviewed and stable  Last Vitals:  Vitals Value Taken Time  BP 109/50 12/12/20 0437  Temp    Pulse 59 12/12/20 0437  Resp 18 12/12/20 0437  SpO2 100 12/12/20 0437    Last Pain:  Vitals:   12/12/20 0100  TempSrc:   PainSc: 6       Patients Stated Pain Goal: 3 (74/[id number]/49 [id number])  Complications: No notable events documented.

## 2020-12-13 LAB — CBC
HCT: 30.7 % — ABNORMAL LOW (ref 36.0–46.0)
Hemoglobin: 9.8 g/dL — ABNORMAL LOW (ref 12.0–15.0)
MCH: 26.9 pg (ref 26.0–34.0)
MCHC: 31.9 g/dL (ref 30.0–36.0)
MCV: 84.3 fL (ref 80.0–100.0)
Platelets: 297 10*3/uL (ref 150–400)
RBC: 3.64 MIL/uL — ABNORMAL LOW (ref 3.87–5.11)
RDW: 14.3 % (ref 11.5–15.5)
WBC: 15.9 10*3/uL — ABNORMAL HIGH (ref 4.0–10.5)
nRBC: 0 % (ref 0.0–0.2)

## 2020-12-13 NOTE — Plan of Care (Signed)
  Problem: Pain Managment: Goal: General experience of comfort will improve Outcome: Completed/Met   Problem: Clinical Measurements: Goal: Cardiovascular complication will be avoided Outcome: Completed/Met   Problem: Activity: Goal: Risk for activity intolerance will decrease Outcome: Completed/Met   Problem: Nutrition: Goal: Adequate nutrition will be maintained Outcome: Completed/Met   Problem: Coping: Goal: Level of anxiety will decrease Outcome: Completed/Met   Problem: Elimination: Goal: Will not experience complications related to urinary retention Outcome: Completed/Met   Problem: Clinical Measurements: Goal: Respiratory complications will improve Outcome: Not Applicable

## 2020-12-13 NOTE — Lactation Note (Signed)
This note was copied from a baby's chart. Lactation Consultation Note  Patient Name: Stacey Ochoa Date: 12/13/2020 Reason for consult: Follow-up assessment;Preterm <34wks;Infant < 6lbs;NICU baby;Other (Comment);Primapara;1st time breastfeeding (AMA) Age:36 hours  Visited with mom of 34 hours old pre-term female, she's a P1. Mom reports that she has tried pumping today but "nothing has come out". Explained to her that pumping this early on is mainly for breast stimulation and not to get volume, she voiced understanding.   Reviewed supply/demand, lactogenesis II, pumping schedule and expectations during the first week of pumping. Mom told LC she's also been reading the NICU booklet and other materials she's been given at the hospital, praised her for her efforts.  Feeding plan:   Encouraged mom to start pumping consistently ideally every 2-3 hours, 8 pumping sessions/24 hours Hand expression and breast massage were also encouraged prior pumping Mom will start using coconut oil prior pumping for breast care   Both grandparents (maternal) present and supportive. Family reported all questions and concerns were answered, they're both aware of [place] services and will call PRN.  Maternal Data    Feeding Mother's Current Feeding Choice: Breast Milk and Donor Milk  Lactation Tools Discussed/Used Tools: Pump;Coconut oil Flange Size: 24 Breast pump type: Double-Electric Breast Pump Pump Education: Setup, frequency, and cleaning;Milk Storage Reason for Pumping: pre-term NICU < 3 lbs Pumping frequency: q 3 hours was recommended (but mom has only pumped once today) Pumped volume: 0 mL (only droplets with hand expression)  Interventions Interventions: Breast feeding basics reviewed;Coconut oil;Education;DEBP  Discharge Pump: DEBP  Consult Status Consult Status: Follow-up Date: 12/14/20 Follow-up type: In-patient    Stacey Ochoa 12/13/2020, 6:13 PM

## 2020-12-13 NOTE — Progress Notes (Signed)
Subjective: Postpartum Day 1: Cesarean Delivery Patient reports incisional pain, tolerating PO, and no problems voiding.    Objective: Vital signs in last 24 hours: Temp:  [97.6 F (36.4 C)-98.3 F (36.8 C)] 98.3 F (36.8 C) (06/18 1940) Pulse Rate:  [62-80] 72 (06/18 1940) Resp:  [15-20] 20 (06/18 1940) BP: (96-118)/(48-79) 107/58 (06/18 1940) SpO2:  [100 %] 100 % (06/18 1940)  Physical Exam:  General: alert, cooperative, and appears stated age Lochia: appropriate Uterine Fundus: firm Incision: no significant drainage DVT Evaluation: No evidence of DVT seen on physical exam.  Recent Labs    12/12/20 0232 12/13/20 0421  HGB 10.8* 9.8*  HCT 32.9* 30.7*    Assessment/Plan: Status post Cesarean section. Doing well postoperatively.  Continue current care. Discharge home tomorrow likely  Stacey Ochoa 12/13/2020, 7:46 PM

## 2020-12-13 NOTE — Plan of Care (Signed)
  Problem: Education: Goal: Knowledge of condition will improve Outcome: Completed/Met   Problem: Education: Goal: Knowledge of condition will improve Outcome: Completed/Met   Problem: Pain Managment: Goal: General experience of comfort will improve Outcome: Completed/Met   Problem: Education: Goal: Knowledge of condition will improve Outcome: Completed/Met   Problem: Activity: Goal: Will verbalize the importance of balancing activity with adequate rest periods Outcome: Completed/Met Goal: Ability to tolerate increased activity will improve Outcome: Completed/Met   Problem: Life Cycle: Goal: Chance of risk for complications during the postpartum period will decrease Outcome: Completed/Met   Problem: Role Relationship: Goal: Ability to demonstrate positive interaction with newborn will improve Outcome: Completed/Met   Problem: Clinical Measurements: Goal: Cardiovascular complication will be avoided Outcome: Completed/Met   Problem: Activity: Goal: Risk for activity intolerance will decrease Outcome: Completed/Met   Problem: Nutrition: Goal: Adequate nutrition will be maintained Outcome: Completed/Met   Problem: Coping: Goal: Level of anxiety will decrease Outcome: Completed/Met   Problem: Elimination: Goal: Will not experience complications related to urinary retention Outcome: Completed/Met

## 2020-12-14 MED ORDER — MEASLES, MUMPS & RUBELLA VAC IJ SOLR
0.5000 mL | Freq: Once | INTRAMUSCULAR | Status: AC
  Administered 2020-12-14: 0.5 mL via SUBCUTANEOUS
  Filled 2020-12-14: qty 0.5

## 2020-12-14 NOTE — Lactation Note (Signed)
This note was copied from a baby's chart. Lactation Consultation Note  Patient Name: Stacey Ochoa [id number] Date: 12/14/2020 Reason for consult: Follow-up assessment;Primapara;1st time breastfeeding;Infant < 6lbs;Preterm <34wks;NICU baby Age:37 hours  LC in to visit with P26 Mom of preterm infant in the NICU.  Mom about to head to baby's room and has packed up all her pump parts to pump at baby's bedside.  Reassurance provided to Mom about onset of milk volume often being 5-6 days post delivery.  Encouraged consistent pumping with a goal of 8 or more pumpings per 24 hrs.   Mom does not have a DEBP at home.  Mom has [facility] health insurance and encouraged Mom to call them ASAP.  Mom informed of DEBP rental program available in hospital gift shop.    Lactation Tools Discussed/Used Tools: Pump;Flanges Breast pump type: Double-Electric Breast Pump Pumping frequency: Q 3hrs Pumped volume: 0 mL (drops)  Interventions Interventions: Breast feeding basics reviewed;Education;Skin to skin;Breast massage;Hand express;Hand pump;DEBP  Discharge Pump: Refer for rental;Advised to call insurance company [facility] Program: No  Consult Status Consult Status: Follow-up Date: 12/15/20 Follow-up type: [place] 12/14/2020, 9:59 AM

## 2020-12-14 NOTE — Progress Notes (Signed)
Subjective: Postpartum Day 1: Cesarean Delivery Patient reports nausea, vomiting, incisional pain, tolerating PO, and no problems voiding.    Objective: Vital signs in last 24 hours: Temp:  [97.6 F (36.4 C)-99.2 F (37.3 C)] 99.2 F (37.3 C) (06/18 2307) Pulse Rate:  [62-83] 83 (06/18 2307) Resp:  [15-20] 18 (06/18 2307) BP: (96-117)/(45-60) 117/45 (06/18 2307) SpO2:  [100 %] 100 % (06/18 2307)  Physical Exam:  General: alert, cooperative, and no distress Lochia: appropriate Uterine Fundus: firm Incision: no significant drainage, no dehiscence, no significant erythema DVT Evaluation: No evidence of DVT seen on physical exam.  Recent Labs    12/12/20 0232 12/13/20 0421  HGB 10.8* 9.8*  HCT 32.9* 30.7*    Assessment/Plan: Status post Cesarean section. Doing well postoperatively.  Continue current care.  Florian Buff 12/14/2020, 9:13 AM

## 2020-12-15 LAB — SURGICAL PATHOLOGY

## 2020-12-15 MED ORDER — OXYCODONE HCL 5 MG PO TABS: 5.0000 mg | ORAL_TABLET | ORAL | 0 refills | Status: DC | PRN

## 2020-12-15 MED ORDER — IBUPROFEN 600 MG PO TABS: 600.0000 mg | ORAL_TABLET | Freq: Four times a day (QID) | ORAL | 0 refills | Status: DC

## 2020-12-15 NOTE — Lactation Note (Signed)
This note was copied from a baby's chart. Lactation Consultation Note  Patient Name: Stacey Ochoa [id number] Date: 12/15/2020 Reason for consult: Follow-up assessment;1st time breastfeeding;Primapara;NICU baby;Preterm <34wks;Infant < 6lbs Age:36 hours  LC in to visit with P21 Mom of preterm infant in the NICU.  Baby being gavage fed donor breast milk.    Mom states she pumped 4 times yesterday.  Reviewed pumping frequency and encouraged Mom to pump 8 or more times per 24 hrs. Mom has storage bottles and breastmilk labels and has been transporting about 1 ml to NICU.  Mom aware of oral care with colostrum.    Encouraged Mom to ask to hold baby STS today.  Talked about benefits to baby and to Mom.  Belly band provided for STS and hand's free pumping.  Mom encouraged to call her insurance company about obtaining a DEBP for home use.  Mom is moving to baby's room and will be utilizing the pump in baby's room.  Mom understands about the pump rental program in gift shop (they ran out of pumps over the weekend).  Engorgement prevention and treatment reviewed and Mom is aware of lactation support while baby is in NICU.   Lactation Tools Discussed/Used Tools: Pump Breast pump type: Double-Electric Breast Pump Pumping frequency: 4 X/24 hrs Pumped volume: 1 mL   Discharge Discharge Education: Engorgement and breast care  Consult Status Consult Status: Follow-up Date: 12/18/20 Follow-up type: In-patient    Broadus John 12/15/2020, 8:16 AM

## 2020-12-15 NOTE — Discharge Summary (Signed)
Physician Discharge Summary  Patient ID: Stacey Ochoa MRN: [id number] DOB/AGE: February 12, 1986 35 y.o.  Admit date: 12/06/2020 Discharge date: 12/15/2020  Admission Diagnoses:PTL/breech  Discharge Diagnoses:  Principal Problem:   Preterm labor in third trimester Active Problems:   Malpresentation of fetus   Late prenatal care   Fibroid uterus   Cervical insufficiency during pregnancy, antepartum   Discharged Condition: good  Hospital Course: unremarkable post cesarean course  Consults: None  Significant Diagnostic Studies: labs:   Results for orders placed or performed during the hospital encounter of 12/06/20 (from the past 72 hour(s))  CBC     Status: Abnormal   Collection Time: 12/13/20  4:21 AM  Result Value Ref Range   WBC 15.9 (H) 4.0 - 10.5 K/uL   RBC 3.64 (L) 3.87 - 5.11 MIL/uL   Hemoglobin 9.8 (L) 12.0 - 15.0 g/dL   HCT 30.7 (L) 36.0 - 46.0 %   MCV 84.3 80.0 - 100.0 fL   MCH 26.9 26.0 - 34.0 pg   MCHC 31.9 30.0 - 36.0 g/dL   RDW 14.3 11.5 - 15.5 %   Platelets 297 150 - 400 K/uL   nRBC 0.0 0.0 - 0.2 %    Comment: Performed at [facility], [place] [address] [place], [place] [address]     Treatments: surgery: Primary Caesarean section  Discharge Exam: Blood pressure (!) 106/56, pulse 72, temperature 97.8 F (36.6 C), temperature source Oral, resp. rate 18, height 5\' 6"  (1.676 m), weight 104.9 kg, SpO2 98 %, unknown if currently breastfeeding. General appearance: alert, cooperative, and no distress GI: soft, non-tender; bowel sounds normal; no masses,  no organomegaly Incision/Wound:clean dry intacvt  Disposition: Discharge disposition: 01-Home or Self Care       Discharge Instructions     Diet - low sodium heart healthy   Complete by: As directed    Driving restriction    Complete by: As directed    Avoid driving for at least 1 weeks.   Lifting restrictions   Complete by: As directed    Weight restriction of baby lbs.   Sexual activity    Complete by: As directed    No sex for 6 weeks        Follow-up Information     [facility] Follow up.   Contact information: [address] [id number] [phone]                Signed: Florian Buff 12/15/2020, 9:30 AM

## 2020-12-16 ENCOUNTER — Other Ambulatory Visit: Payer: 59

## 2020-12-16 ENCOUNTER — Ambulatory Visit: Payer: Self-pay

## 2020-12-16 ENCOUNTER — Ambulatory Visit: Payer: 59

## 2020-12-16 NOTE — Lactation Note (Signed)
This note was copied from a baby's chart. Lactation Consultation Note Maternal f/u on day 4 of infant's life. Mother pumping infrequently. Reinforced ed.   Patient Name: Stacey Ochoa [id number] Date: 12/16/2020 Reason for consult: Follow-up assessment Age:36 days  Maternal Data  Mother has not pumped today. No s/s of engorgement.   Feeding Mother's Current Feeding Choice: Breast Milk and Donor Milk   Interventions Interventions: Education   Consult Status Consult Status: Follow-up Follow-up type: In-patient   Gwynne Edinger, MA IBCLC 12/16/2020, 11:45 AM

## 2020-12-16 NOTE — Anesthesia Postprocedure Evaluation (Signed)
Anesthesia Post Note  Patient: Stacey Ochoa  Procedure(s) Performed: [place]     Patient location during evaluation: PACU Anesthesia Type: Spinal Level of consciousness: awake and alert and oriented Pain management: pain level controlled Vital Signs Assessment: post-procedure vital signs reviewed and stable Respiratory status: spontaneous breathing, nonlabored ventilation and respiratory function stable Cardiovascular status: blood pressure returned to baseline Postop Assessment: no apparent nausea or vomiting, spinal receding, no headache and no backache Anesthetic complications: no   No notable events documented.               Brennan Bailey

## 2020-12-17 ENCOUNTER — Ambulatory Visit: Payer: Self-pay

## 2020-12-17 NOTE — Lactation Note (Signed)
This note was copied from a baby's chart. Lactation Consultation Note Mother at risk for low milk supply because of infrequent breast stimulation.   Patient Name: Stacey Ochoa [id number] Date: 12/17/2020 Reason for consult: Follow-up assessment Age:36 days   Feeding Mother's Current Feeding Choice: Breast Milk and Donor Milk    Lactation Tools Discussed/Used Pumping frequency: 2x Pumped volume: 30 mL  Interventions Interventions: Education  Consult Status Consult Status: Follow-up Follow-up type: [place], [place] IBCLC 12/17/2020, 10:27 AM

## 2020-12-18 ENCOUNTER — Ambulatory Visit: Payer: Self-pay

## 2020-12-18 NOTE — Lactation Note (Signed)
This note was copied from a baby's chart. Lactation Consultation Note  Patient Name: Stacey Ochoa Date: 12/18/2020 Reason for consult: Follow-up assessment;NICU baby Age:36 days  LC in to room for follow up. Mother states it has been a hectic day and has not been able to pump. Reinforced the importance of pumping as well as self-care. Praised mother for her presence and encouraged to contact [facility] services as needed.   Lactation Tools Discussed/Used Tools: Pump Flange Size: 24 Breast pump type: Double-Electric Breast Pump Reason for Pumping: NICU baby, stimulation and supplementation Pumping frequency: Mother reports inconsistent pumping Pumped volume:  (did not report)  Interventions Interventions: Education;DEBP;Expressed milk;Breast feeding basics reviewed  Discharge Discharge Education: Engorgement and breast care  Consult Status Consult Status: Follow-up Date: 12/19/20 Follow-up type: In-patient    Talayeh Bruinsma A Higuera Ancidey 12/18/2020, 5:27 PM

## 2020-12-19 ENCOUNTER — Ambulatory Visit: Payer: Self-pay

## 2020-12-19 NOTE — Lactation Note (Signed)
This note was copied from a baby's chart. Lactation Consultation Note Mother at risk for low milk volume because of infrequent pumping sessions.  Patient Name: Stacey Ochoa [id number] Date: 12/19/2020 Reason for consult: Follow-up assessment Age:35 days  Maternal Data  Breast pump type: Double-Electric Breast Pump Pump Education:  (frequency) Pumping frequency: 4x yesterday Pumped volume:  (21mL at last pumping session)  Feeding Mother's Current Feeding Choice: Breast Milk and Donor Milk   Interventions Interventions: Education  Consult Status Consult Status: Follow-up Follow-up type: In-patient   Gwynne Edinger, MA IBCLC 12/19/2020, 9:41 AM

## 2020-12-22 ENCOUNTER — Ambulatory Visit: Payer: Self-pay

## 2020-12-22 NOTE — Lactation Note (Signed)
This note was copied from a baby's chart. Lactation Consultation Note Mother is concerned about recent use of a chemical spray while pumping. I called manufacturer for product information and guidance. I provided RN with a case number, phone number, and details needed from mother. RN will provide the information to mother so she can communicate directly with manufacturer. RN will consult with pharmacy/[facility] for guidance on use of mother's milk today.    Patient Name: Stacey Ochoa [id number] Date: 12/22/2020   Age:36 days   Gwynne Edinger, MA IBCLC 12/22/2020, 6:24 PM

## 2020-12-23 ENCOUNTER — Other Ambulatory Visit: Payer: Self-pay

## 2020-12-23 ENCOUNTER — Ambulatory Visit (INDEPENDENT_AMBULATORY_CARE_PROVIDER_SITE_OTHER): Payer: 59

## 2020-12-23 DIAGNOSIS — Z4889 Encounter for other specified surgical aftercare: Secondary | ICD-10-CM

## 2020-12-23 NOTE — Progress Notes (Signed)
Subjective:     Stacey Ochoa is a 35 y.o. female who presents to the clinic 2 weeks status post  cesarean section  for  preterm delivery . Eating a regular diet without difficulty. Bowel movements are normal. The patient is not having any pain.  The following portions of the patient's history were reviewed and updated as appropriate: allergies, current medications, past family history, past medical history, past social history, past surgical history, and problem list.  Review of Systems Pertinent items are noted in HPI.    Objective:   General:  alert and no distress  Abdomen: soft, bowel sounds active, non-tender  Incision:   healing well, no drainage, no erythema, no hernia, no seroma, no swelling, no dehiscence, incision well approximated     Assessment:    Doing well postoperatively.   Plan:    1. Continue any current medications. 2. Wound care discussed. 3. Activity restrictions:  per protocol from cesarean deliveray 4. Anticipated return to work:  unknown at this time . 5. Follow up: 4 weeks for postpartum exam.   Berneice Heinrich, CMA

## 2020-12-24 ENCOUNTER — Ambulatory Visit: Payer: Self-pay

## 2020-12-24 NOTE — Lactation Note (Signed)
This note was copied from a baby's chart. Lactation Consultation Note  Patient Name: Stacey Ochoa [id number] Date: 12/24/2020 Reason for consult: Follow-up assessment;Mother's request;Infant < 6lbs;Preterm <34wks;NICU baby;Primapara;1st time breastfeeding Age:36 days  RN called as Mom requested to speak with LC. Mom had questions about using a suppository for constipation and whether it was safe while pumping for her baby.  Reassured Mom that glycerin suppositories and Miralax laxative are both safe for use while breastfeeding or breast milk pumping.  Gave Mom an elastic belly band to use for STS and hand's free pumping.  Lactation Tools Discussed/Used Tools: Pump Breast pump type: Double-Electric Breast Pump Pumping frequency: 6 X per 24 hrs Pumped volume: 30 mL  Interventions Interventions: Skin to skin;Breast massage;Hand express;DEBP   Consult Status Consult Status: Follow-up Date: 12/31/20 Follow-up type: In-patient    Broadus John 12/24/2020, 4:19 PM

## 2021-01-02 ENCOUNTER — Ambulatory Visit: Payer: Self-pay

## 2021-01-02 NOTE — Lactation Note (Signed)
This note was copied from a baby's chart. Lactation Consultation Note Mother has not received pump from insurance company yet. She is only pumping at hospital. [facility] provided information on obtaining pump for home use.  Patient Name: Stacey Ochoa Date: 01/02/2021 Reason for consult: Follow-up assessment Age:36 wk.o.  Maternal Data  Pumping frequency: 2-3 x day. Mother unsure of yield. Discharge Education: Other (comment) (referred to [employer] for insurance pump) Feeding Mother's Current Feeding Choice: Breast Milk and Donor Milk  Interventions Interventions: Education  Consult Status Consult Status: Follow-up Follow-up type: [place], [place] IBCLC 01/02/2021, 6:12 PM

## 2021-01-09 ENCOUNTER — Ambulatory Visit: Payer: Self-pay

## 2021-01-09 NOTE — Lactation Note (Signed)
This note was copied from a baby's chart. Lactation Consultation Note LC to room for f/u visit with mother. She continues to pump but is unsure about the frequency. From her recall, it appears that she is yielding about 10-45mLs per session. LC reviewed recommended pumping frequency. Mother had no questions/concerns at this time.   Patient Name: Stacey Ochoa Date: 01/09/2021 Reason for consult: Follow-up assessment Age:36 wk.o.  Feeding Mother's Current Feeding Choice: Breast Milk and Donor Milk  Consult Status Consult Status: Follow-up Follow-up type: In-patient  Gwynne Edinger, MA IBCLC 01/09/2021, 1:17 PM

## 2021-01-13 ENCOUNTER — Encounter: Payer: Self-pay | Admitting: Obstetrics and Gynecology

## 2021-01-13 ENCOUNTER — Ambulatory Visit (INDEPENDENT_AMBULATORY_CARE_PROVIDER_SITE_OTHER): Payer: 59 | Admitting: Obstetrics and Gynecology

## 2021-01-13 ENCOUNTER — Other Ambulatory Visit: Payer: Self-pay

## 2021-01-13 DIAGNOSIS — Z30011 Encounter for initial prescription of contraceptive pills: Secondary | ICD-10-CM

## 2021-01-13 MED ORDER — NORETHINDRONE 0.35 MG PO TABS: 1.0000 | ORAL_TABLET | Freq: Every day | ORAL | 7 refills | Status: AC

## 2021-01-13 NOTE — Progress Notes (Signed)
[place] Partum Visit Note  Stacey Ochoa is a 35 y.o. G49P0101 female who presents for a postpartum visit. She is 4 week postpartum following a primary cesarean section.  I have fully reviewed the prenatal and intrapartum course. The delivery was at [redacted]w[redacted]d gestational weeks.  Anesthesia: spinal. Postpartum course has been unremarkable. Baby is doing well. Baby is feeding by breast. Bleeding thick, heavy lochia. Bowel function is normal. Bladder function is normal. Patient is not sexually active. Contraception method is oral progesterone-only contraceptive. Postpartum depression screening: negative. EPDS: 0   The pregnancy intention screening data noted above was reviewed. Potential methods of contraception were discussed. The patient elected to proceed with progesterone only oral contraceptive.   Edinburgh Postnatal Depression Scale - 01/13/21 1024       Edinburgh Postnatal Depression Scale:  In the Past 7 Days   I have been able to laugh and see the funny side of things. 0    I have looked forward with enjoyment to things. 0    I have blamed myself unnecessarily when things went wrong. 0    I have been anxious or worried for no good reason. 0    I have felt scared or panicky for no good reason. 0    Things have been getting on top of me. 0    I have been so unhappy that I have had difficulty sleeping. 0    I have felt sad or miserable. 0    I have been so unhappy that I have been crying. 0    The thought of harming myself has occurred to me. 0    Edinburgh Postnatal Depression Scale Total 0              The following portions of the patient's history were reviewed and updated as appropriate: allergies, current medications, past family history, past medical history, past social history, past surgical history, and problem list.  Review of Systems Pertinent items are noted in HPI.    Objective:  BP 112/76 (BP Location: Right Arm, Patient Position: Sitting, Cuff Size: Large)    Pulse 67   Ht 5\' 7"  (1.702 m)   Wt 221 lb (100.2 kg)   LMP  (Exact Date) Comment: LMP sometime in November  Breastfeeding Yes   BMI 34.61 kg/m    General:  alert, cooperative, and no distress   Breasts:  deferred  Lungs: clear to auscultation bilaterally  Heart:  regular rate and rhythm  Abdomen: soft, non-tender; bowel sounds normal; no masses,  no organomegaly and c/s scar well healed, C/D/I   Vulva:  not evaluated  Vagina: not evaluated  Cervix:  Not evaluated  Corpus: normal  Adnexa:  not evaluated  Rectal Exam: Not performed.        Assessment:    Normal postpartum postpartum exam. Pap smear not done at today's visit.   Plan:   Essential components of care per ACOG recommendations:  1.  Mood and well being: Patient with negative depression screening today. Reviewed local resources for support.  - Patient does not use tobacco. If using tobacco we discussed reduction and for recently cessation risk of relapse - hx of drug use? No    2. Infant care and feeding:  -Patient currently breastmilk feeding? Yes If breastmilk feeding discussed return to work and pumping. If needed, patient was provided letter for work to allow for every 2-3 hr pumping breaks, and to be granted a private location to express breastmilk and refrigerated  area to store breastmilk. Reviewed importance of draining breast regularly to support lactation.Pt is pumping consistently for infant in the NICU -Social determinants of health (SDOH) reviewed in EPIC. No concerns.  3. Sexuality, contraception and birth spacing - Patient does not want a pregnancy in the next year.  Desired family size is 1 children.  - Reviewed forms of contraception in tiered fashion. Patient desired oral progesterone-only contraceptive today.  Information given regarding IUD and nexplanon. - Discussed birth spacing of 18 months  4. Sleep and fatigue -Encouraged family/partner/community support of 4 hrs of uninterrupted sleep to help  with mood and fatigue  5. Physical Recovery  - Discussed patients delivery and complications - Patient has urinary incontinence? No - Patient is safe to resume physical and sexual activity  6.  Health Maintenance - Last pap smear done 11/27/20 and was normal with negative HPV.   F/U in 1 year or prn for annual exam  Griffin Basil, MD [facility] [employer], [place]

## 2021-01-13 NOTE — Patient Instructions (Signed)
Read progesterone only pill section closely

## 2021-01-15 ENCOUNTER — Ambulatory Visit: Payer: Self-pay

## 2021-01-15 NOTE — Lactation Note (Signed)
This note was copied from a baby's chart. Lactation Consultation Note  Patient Name: Stacey Ochoa [id number] Date: 01/15/2021 Reason for consult: Follow-up assessment;NICU baby;Preterm <34wks Age:36 wk.o.  Lactation follow up with Ms. Crawshaw. She has still not obtained her personal insurance pump. She learned last week that the delay was due to need for her provider to place order. She has since followed up with her provided and they put in an order for a breast pump. She will now follow up with her insurance company this week to verify that pump will be shipped.  She is using the hospital pump while staying at the hospital and is not pumping at home. We discussed her goals and discussed integrating additional sessions when her insurance pump arrives. I praised her for providing her breast milk to baby and for pumping often when present at the hospital.  Maternal Data Does the patient have breastfeeding experience prior to this delivery?: No  Feeding Mother's Current Feeding Choice: Breast Milk and Formula   Lactation Tools Discussed/Used Breast pump type: Double-Electric Breast Pump Pump Education: Setup, frequency, and cleaning Reason for Pumping: NICU; not yet breast feeding Pumping frequency: 4-5 times/day Pumped volume: 28 mL (mls)  Interventions Interventions: Education  Discharge Pump: DEBP;Advised to call insurance company  Consult Status Consult Status: Follow-up Follow-up type: In-patient    Lenore Manner 01/15/2021, 9:31 AM

## 2021-01-18 ENCOUNTER — Ambulatory Visit: Payer: Self-pay

## 2021-01-18 NOTE — Lactation Note (Signed)
This note was copied from a baby's chart. Lactation Consultation Note  Patient Name: Stacey Ochoa [id number] Date: 01/18/2021 Reason for consult: Follow-up assessment;NICU baby;Late-preterm 34-36.6wks;Infant < 6lbs Age:36 wk.o.  [place] referral formed faxed to [facility] office successfully.   Maternal Data    Feeding Mother's Current Feeding Choice: Breast Milk and Formula Nipple Type: Dr. Myra Gianotti Preemie  Lactation Tools Discussed/Used Tools: Pump;Flanges Flange Size: 24 Breast pump type: Double-Electric Breast Pump Pump Education: Setup, frequency, and cleaning;Milk Storage Reason for Pumping: NICU infant (Only pumping at the hospital) Pumping frequency: 4-5 times/24 hours Pumped volume: 30 mL (it varies)  Interventions Interventions: Breast feeding basics reviewed;DEBP;Education  Discharge Pump: DEBP;[employer] Pump (advised to check with Thedore Mins from [employer] pump; her [place] until July 28th)  Consult Status Consult Status: Follow-up Follow-up type: In-patient    Stacey Ochoa 01/18/2021, 9:04 PM

## 2021-01-18 NOTE — Lactation Note (Signed)
This note was copied from a baby's chart. Lactation Consultation Note  Patient Name: Stacey Ochoa [id number] Date: 01/18/2021 Reason for consult: Follow-up assessment;NICU baby;Late-preterm 34-36.6wks;Infant < 6lbs Age:36 wk.o.  Visited with mom of 67 53/50 weeks old (adjusted) NICU female; she's still waiting for her insurance pump, mom showed LC order # and they won't be shipping it until July 28th; order is still "processing". She pumps only at baby's bedside, offered gift shod rental but she voiced it's too expensive.  She's very overwhelmed and stressed out and voiced that she's tired of pumping, she's planning on doing both (breast and formula) once baby is discharged but she still wants to work on BF due to all the formula shortages.   Offered mom to send a [facility] referral and also to contact [place] from [place] pumps. Mom will try to apply for [facility], LC left contact info for [facility] and [place] pumps.  Plan of care:  Mom will contact the [facility] office or [employer] [facility] to see if she can get her pump sooner Encouraged to pump more often, but current pumping frequency are within mom's goals  No other support person. All questions and concerns answered, mom will call NICU LC PRN.  Maternal Data   Mom's volume is BNL, probably due lack of breast stimulation  Feeding Mother's Current Feeding Choice: Breast Milk and Formula Nipple Type: Dr. Myra Gianotti Preemie  Lactation Tools Discussed/Used Tools: Pump;Flanges Flange Size: 24 Breast pump type: Double-Electric Breast Pump Pump Education: Setup, frequency, and cleaning;Milk Storage Reason for Pumping: NICU infant (Only pumping at the hospital) Pumping frequency: 4-5 times/24 hours Pumped volume: 30 mL (it varies)  Interventions Interventions: Breast feeding basics reviewed;DEBP;Education  Discharge Pump: DEBP;[employer]ork Pump (advised to check with Thedore Mins from [employer] pump; her [place] until July 28th)  Consult  Status Consult Status: Follow-up Follow-up type: In-patient    Brynnly Bonet Francene Boyers 01/18/2021, 5:46 PM

## 2021-01-22 ENCOUNTER — Ambulatory Visit: Payer: Self-pay

## 2021-01-22 NOTE — Lactation Note (Signed)
This note was copied from a baby's chart. Lactation Consultation Note  Patient Name: Stacey Ochoa [id number] Date: 01/22/2021 Reason for consult: Follow-up assessment;NICU baby;Late-preterm 34-36.6wks;Infant < 6lbs Age:36 wk.o.  Visited with mom of a 74 4/7 (adjusted) weeks old NICU female, LC stopped by to check on her pumping status, mom voiced that she applied for [facility] but didn't qualify, so her pump was denied, but voiced that a friend of hers is getting her a [employer] pump that she'll start using tonight, until her insurance pump arrives in the mail next week.  Explained to mom that at this point it will be very challenging to overturned her supply issues due to the lack of breast stimulation the last 5 weeks, but also mentioned that any amount of breastmilk is still going to be beneficial for her baby. Reviewed the benefits of STS care, baby fully clothed at the time of [facility] consultation.  Plan of care:  Mom will start double pumping at home tonight with her new [employer] pump Encouraged mom to keep pumping at the hospital; she understands that baby eats at least 8 times/24 hours, but she's planning on doing both, breast and formula  GOB (maternal) present and supportive. All questions and concerns answered, mom will call NICU LC PRN.  Maternal Data   Mom's milk supply keeps dwindling and is [employer] Mother's Current Feeding Choice: Breast Milk and Formula Nipple Type: Dr. Lavena Bullion  Lactation Tools Discussed/Used Tools: Pump;Flanges;Coconut oil Flange Size: 24 Breast pump type: Double-Electric Breast Pump Pump Education: Setup, frequency, and cleaning;Milk Storage Reason for Pumping: NICU infant (only pumping at the hospital) Pumping frequency: 3-4 times/24 hours Pumped volume: 25 mL (it's mostly < 1 oz now)  Interventions Interventions: Breast feeding basics reviewed;DEBP;Education  Discharge Pump: DEBP;Personal (her insurance finally shipped her DEBP, she'll be  getting it next week)  Consult Status Consult Status: Follow-up Follow-up type: In-patient    Mayelin Panos Francene Boyers 01/22/2021, 2:40 PM

## 2023-03-21 IMAGING — US US MFM OB LIMITED
1 series · 14 of 28 positions shown · non-contrast
Comparison: none

[Series 1: us mfm ob limited · 53 acquisitions, 14 frames shown]
[im 2/53]
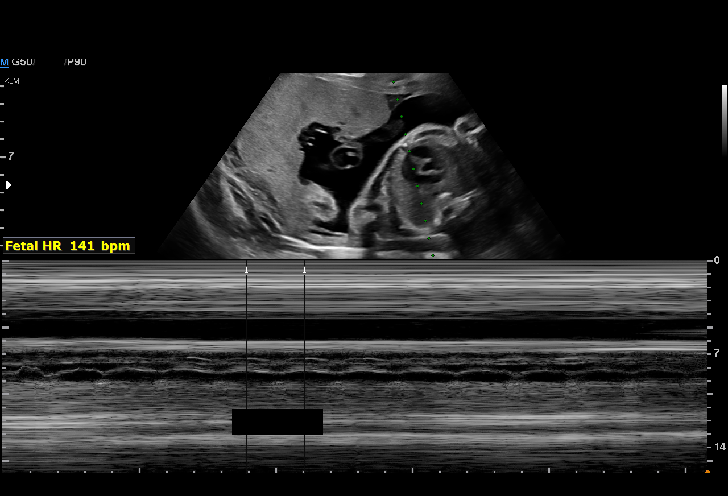
[im 6/53]
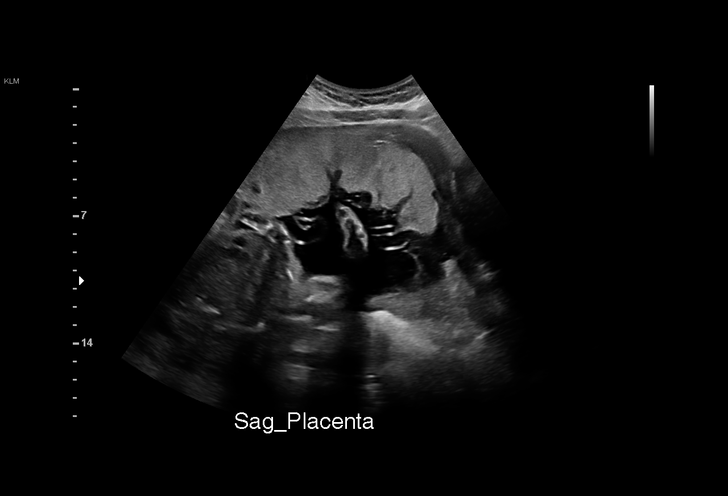
[im 10/53]
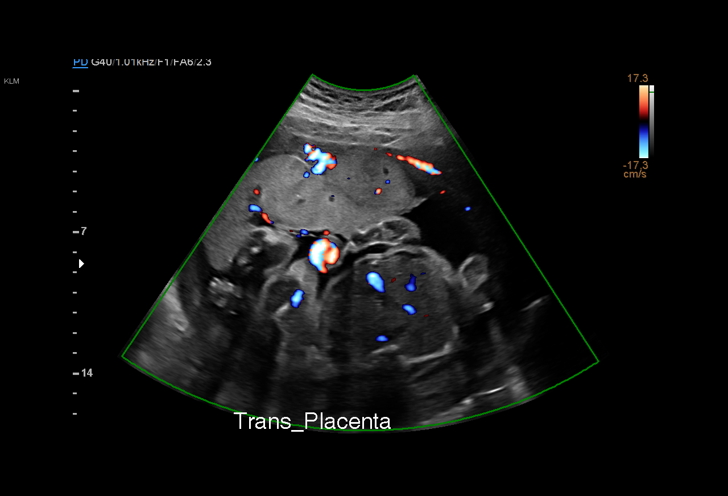
[im 14/53]
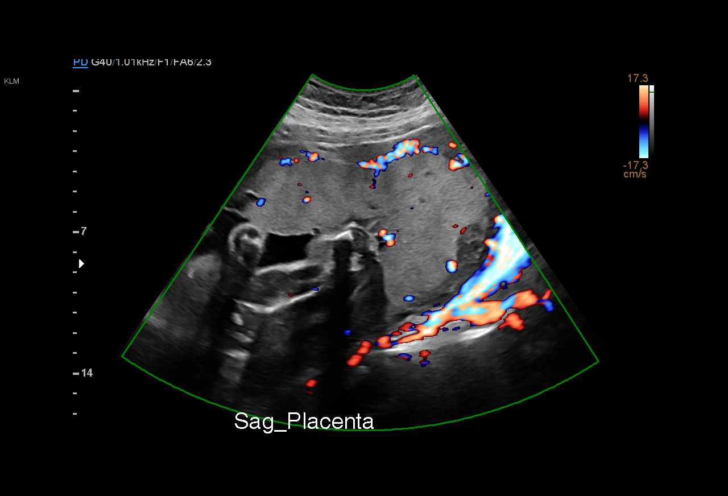
[im 18/53]
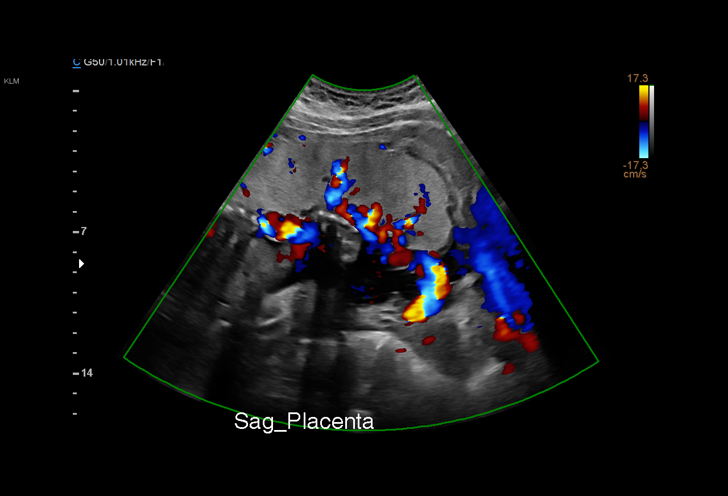
[im 22/53]
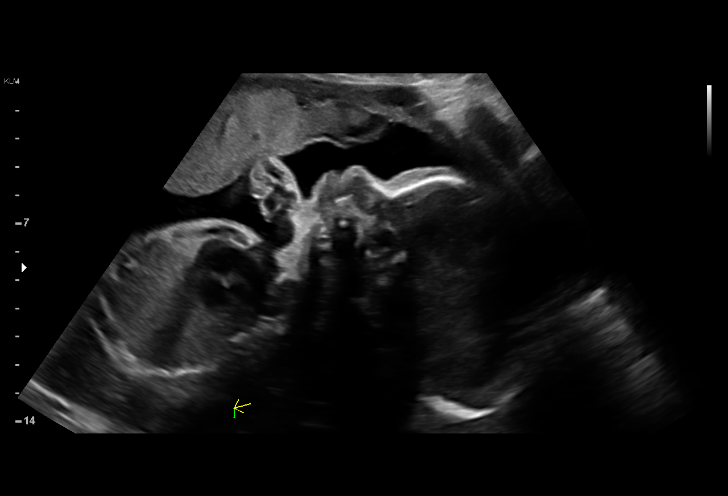
[im 26/53]
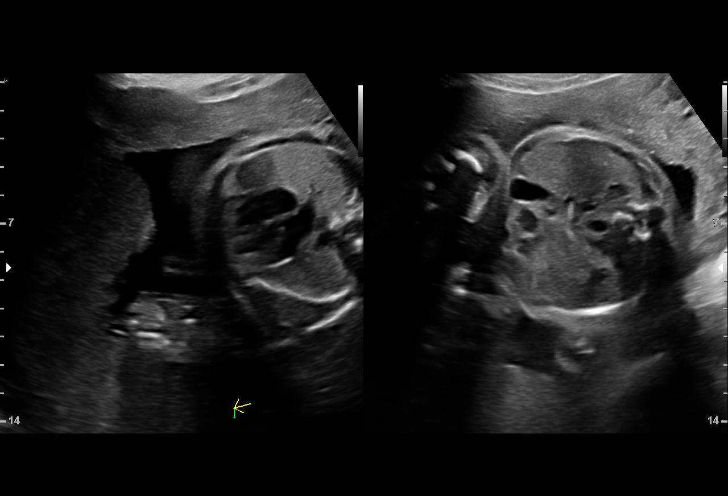
[im 29/53]
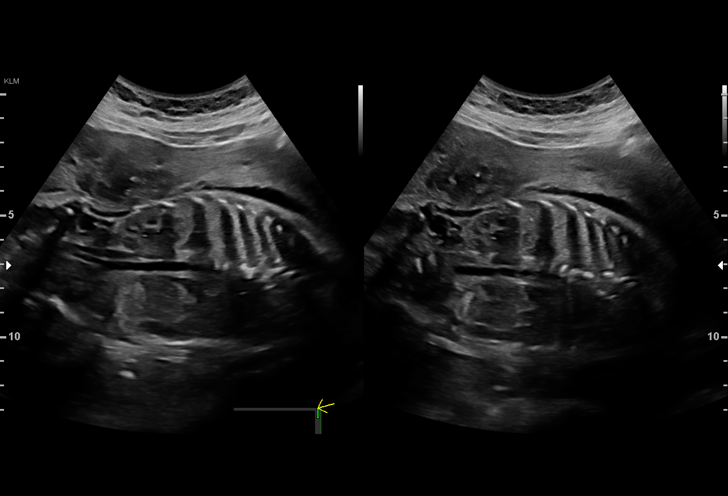
[im 33/53]
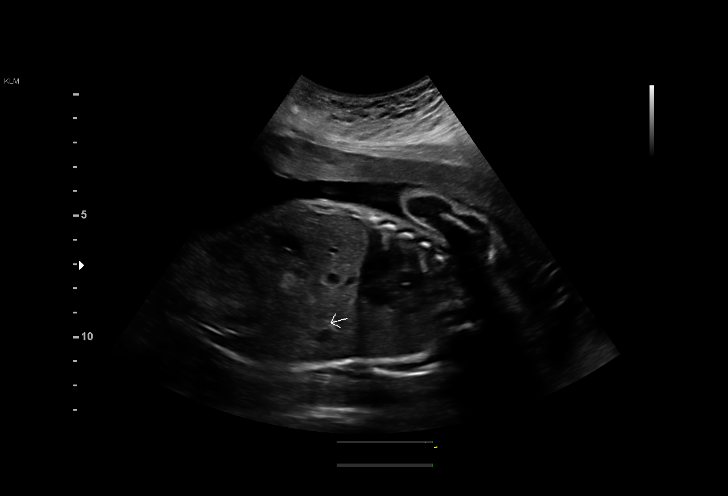
[im 37/53]
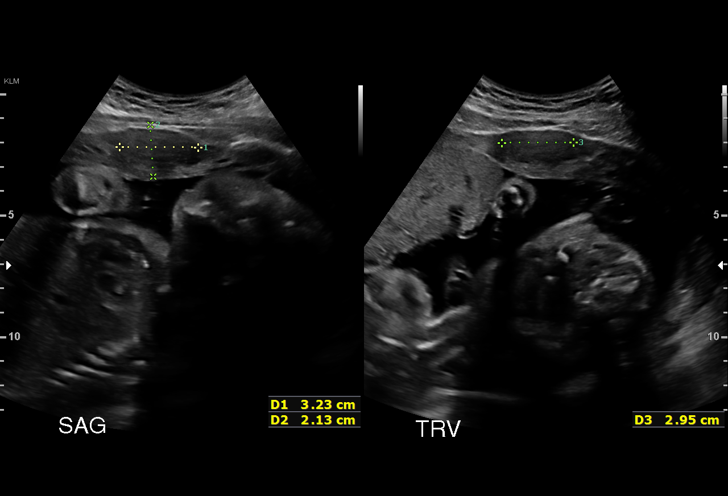
[im 41/53]
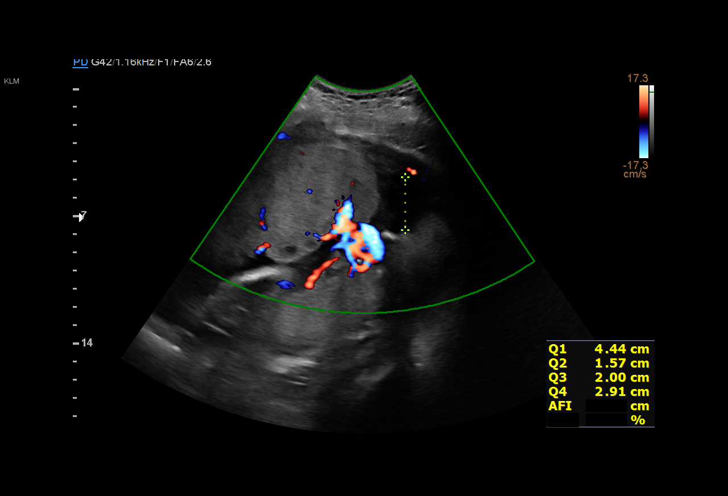
[im 45/53]
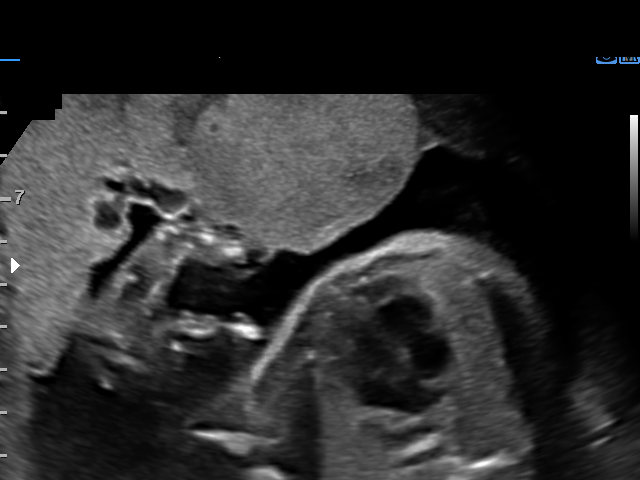
[im 49/53]
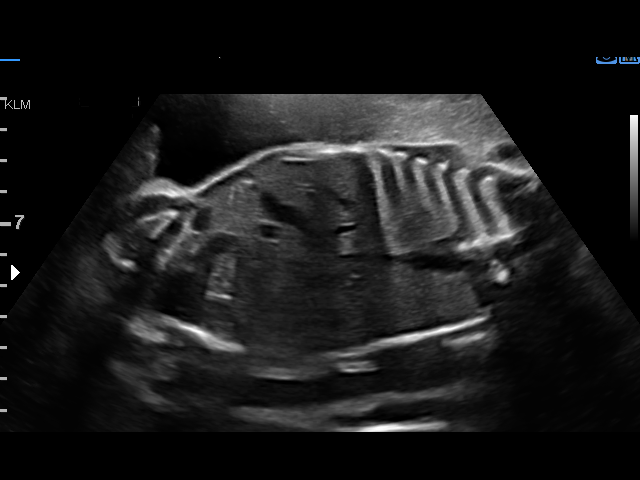
[im 53/53]
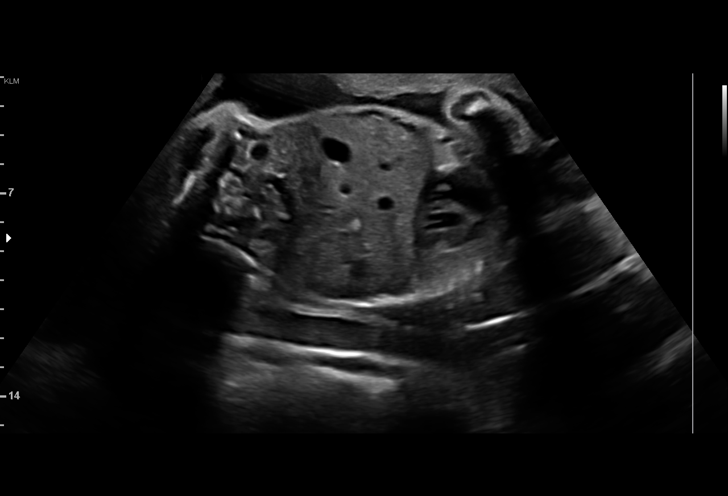

[14 of 28 positions shown; findings below may reference images not displayed]

[facility] [HOSPITAL]
 Referred By:       EHRENFRIED DIGRUBER          [facility]          [facility]
                    [facility]                              [HOSPITAL]

 1   US MFM OB LIMITED                    76815.01     GIWRGOS EIRINI ITALOS

Indications

 Vaginal bleeding in pregnancy, third trimester
 28 weeks gestation of pregnancy
 Late to prenatal care, third trimester
Fetal Evaluation

 Num Of Fetuses:          1
 Fetal Heart Rate(bpm):   141
 Cardiac Activity:        Observed
 Presentation:            Cephalic
 Placenta:                Anterior
 P. Cord Insertion:       Visualized

 Amniotic Fluid
 AFI FV:      Within normal limits

 AFI Sum(cm)     %Tile       Largest Pocket(cm)
 10.92           19

 RUQ(cm)       RLQ(cm)        LUQ(cm)        LLQ(cm)
 4.44          2.91           1.57           2

 Comment:     No placental abruption or previa identified.
OB History

 Gravidity:     1         Term:  0          Prem:  0        SAB:   0
 TOP:           0       Ectopic: 0         Living: 0
Gestational Age
 Best:           28w 5d    Det. By:  Early Ultrasound           EDD:  02/15/21
Anatomy

 Heart:                  Appears normal         Diaphragm:              Appears normal
                         (4CH, axis, and situs)
 RVOT:                   Appears normal         Stomach:                Present but small
 LVOT:                   Appears normal         Kidneys:                Appear normal
 Ductal Arch:            Appears normal         Bladder:                Appears normal
Cervix Uterus Adnexa

 Cervix
 Not visualized (advanced GA >59wks)

 Uterus
 Multiple fibroids noted, see table below.

 Adnexa
 No abnormality visualized.
Myomas

 Site                      L(cm)      W(cm)      D(cm)       Location
 Anterior                  3.2        2.1        3           Intramural
 Anterior                  1.7        2.5        2.1         Intramural

 Blood Flow                  RI       PI        Comments

Impression

 Patient was evaluated for c/o vaginal bleeding/brownish
 discharge.
 A limited ultrasound study was performed .Amniotic fluid is
 normal and good fetal activity is seen. Placenta looks normal
 with no evidence of abruption. Ultrasound has limitations in
 diagnosing placental abruption .
 Two small intramural myomas are seen (mesaurements above).
Recommendations

 Patient has an appointment on 12/16/20 for detailed fetal
 anatomical survey.
                 Sowers, Reg

## 2023-03-29 IMAGING — US US MFM OB COMP +14 WKS
1 series · 13 of 28 positions shown · non-contrast
Comparison: none

[Series 1: us mfm ob comp +14 wks · 13 of 103 slices shown]
[im 4/103]
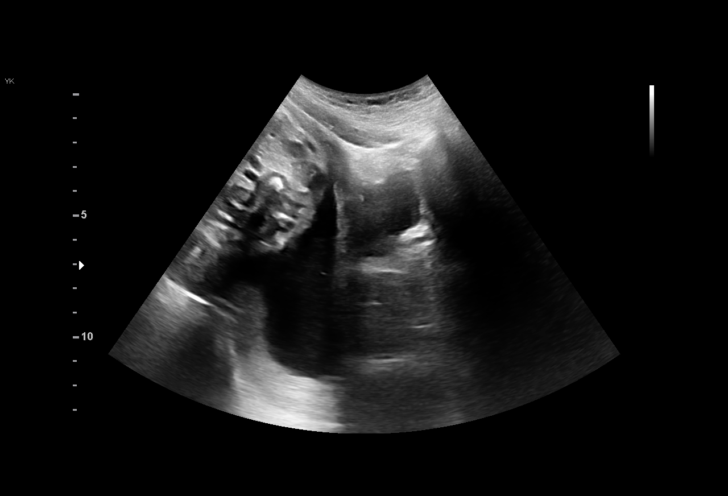
[im 12/103]
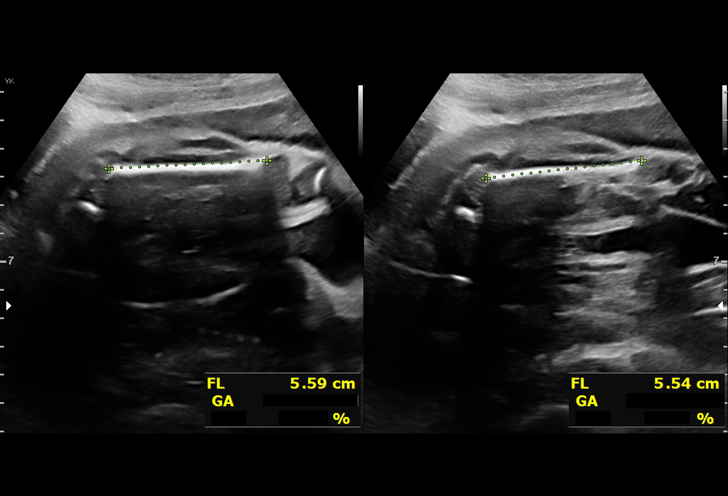
[im 19/103]
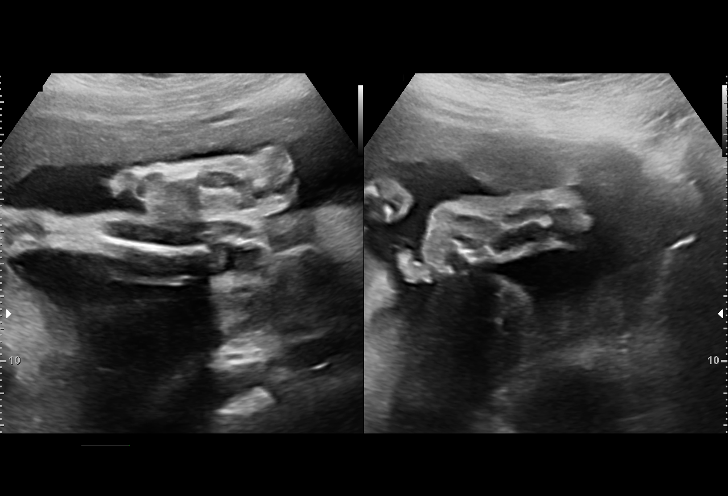
[im 27/103]
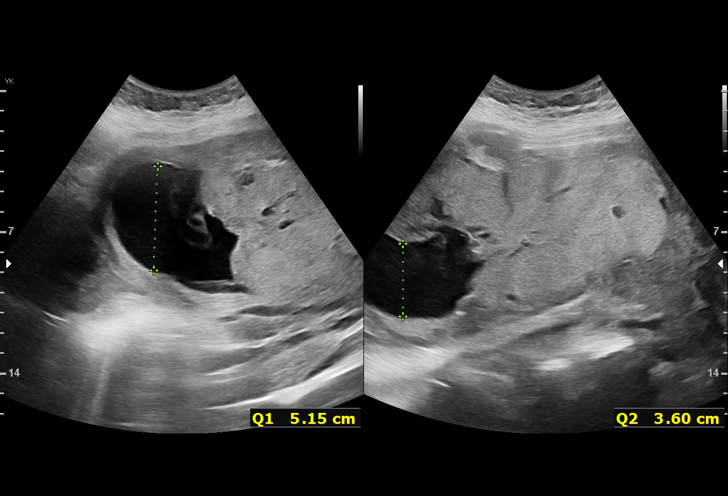
[im 35/103]
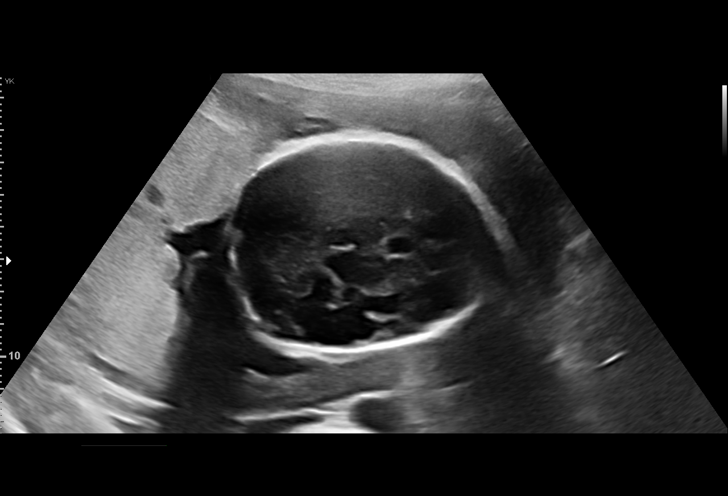
[im 42/103]
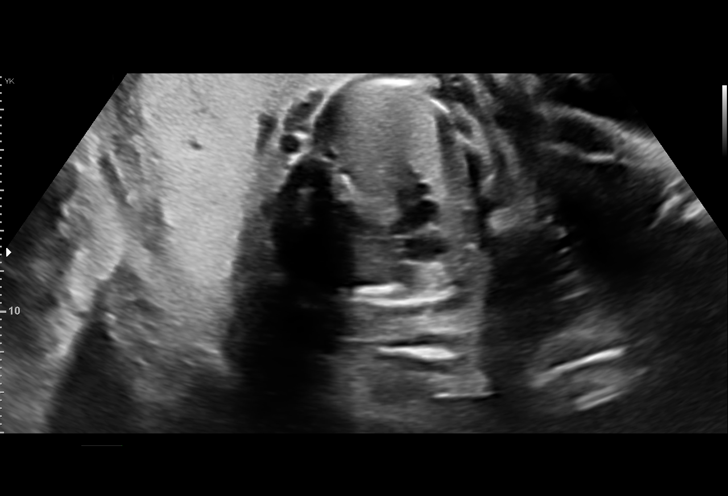
[im 53/103]
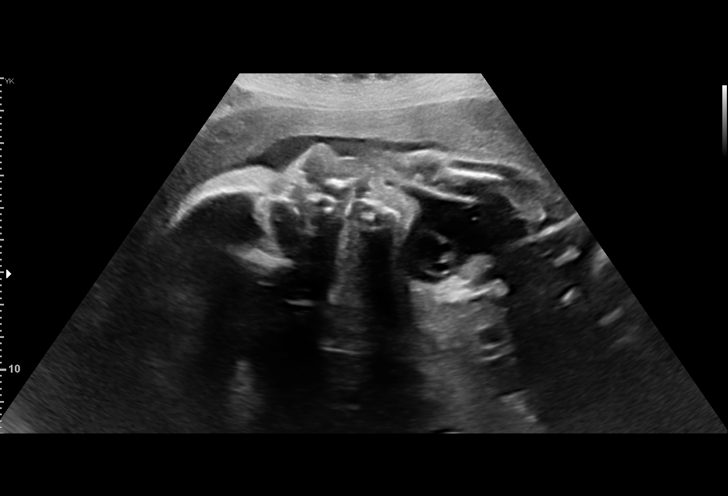
[im 61/103]
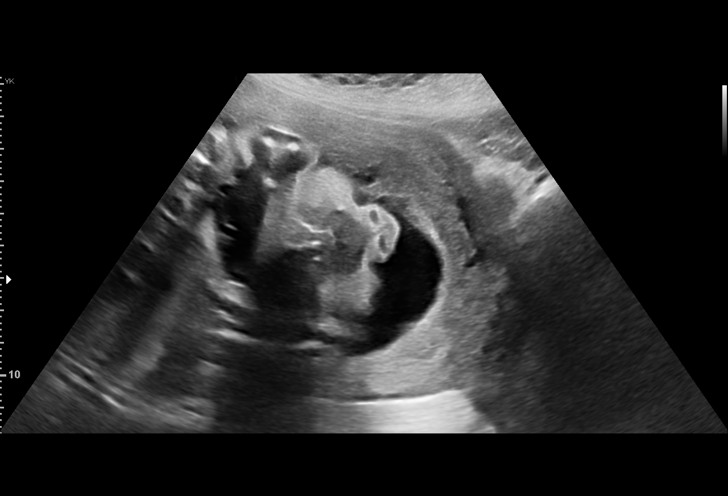
[im 69/103]
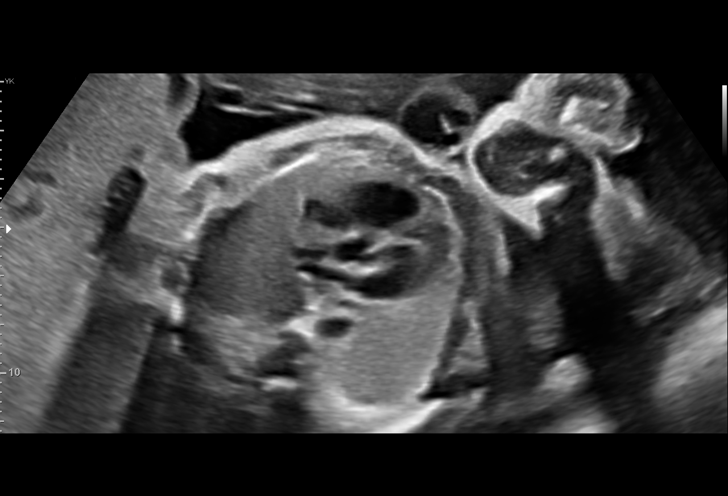
[im 76/103]
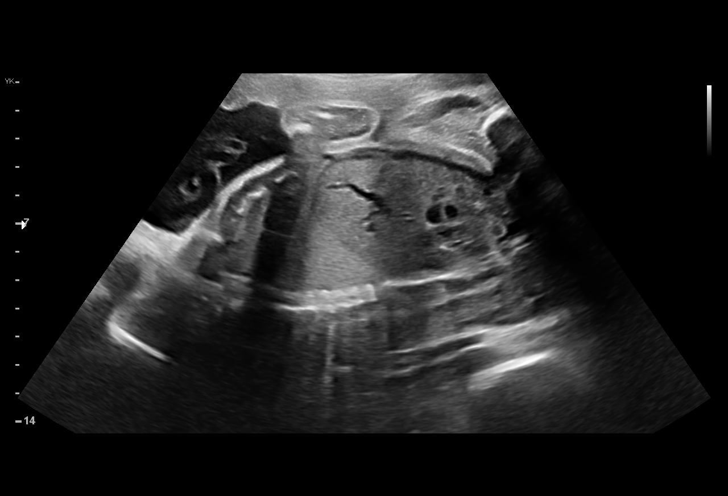
[im 84/103]
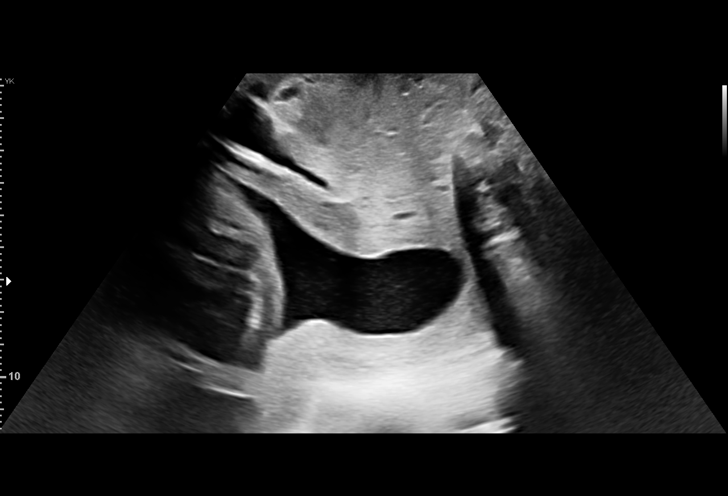
[im 91/103]
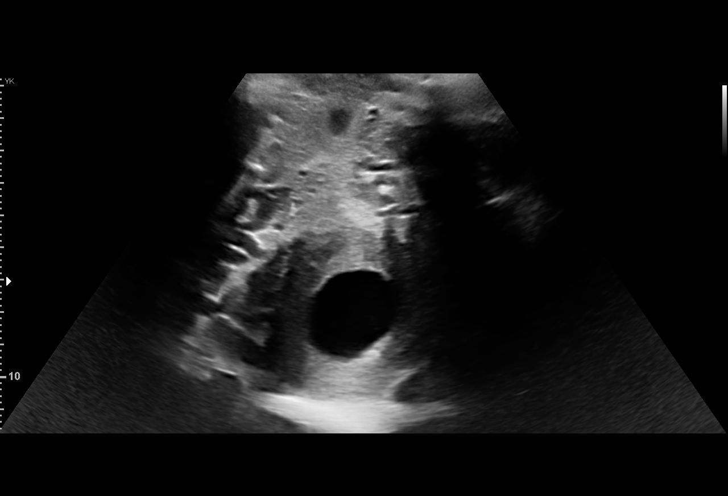
[im 99/103]
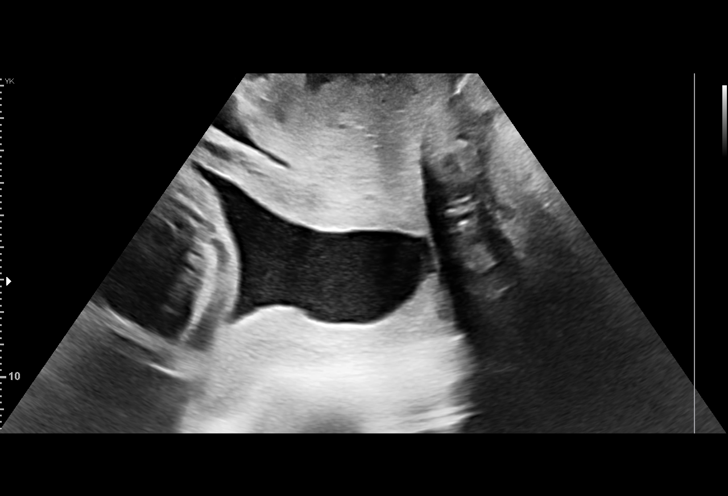

[13 of 28 positions shown; findings below may reference images not displayed]

[facility] [HOSPITAL]
 Referred By:      HUYEN DAVIS          [facility]          [facility]
                   [facility]                              [HOSPITAL]

 1  US MFM OB COMP + 14 WK                76805.01    [facility]

Indications

 Vaginal bleeding in pregnancy, third trimester
 Cervical incompetence, third trimester
 Late to prenatal care, third trimester
 Obesity complicating pregnancy, third
 trimester
 29 weeks gestation of pregnancy
Fetal Evaluation

 Num Of Fetuses:          1
 Fetal Heart Rate(bpm):   148
 Cardiac Activity:        Observed
 Presentation:            Breech
 Placenta:                Right lateral
 P. Cord Insertion:       Visualized, central

 Amniotic Fluid
 AFI FV:      Within normal limits

 AFI Sum(cm)     %Tile       Largest Pocket(cm)
 14.3            48

 RUQ(cm)       RLQ(cm)       LUQ(cm)        LLQ(cm)

Biometry

 BPD:      67.1  mm     G. Age:  27w 0d        < 1  %    CI:        71.15   %    70 - 86
                                                         FL/HC:       21.5  %    19.2 -
 HC:      253.4  mm     G. Age:  27w 4d        < 1  %    HC/AC:       1.00       0.99 -
 AC:      252.3  mm     G. Age:  29w 3d         32  %    FL/BPD:      81.1  %    71 - 87
 FL:       54.4  mm     G. Age:  28w 5d         11  %    FL/AC:       21.6  %    20 - 24
 Est. FW:    2424   gm    2 lb 14 oz     11  %
OB History

 Gravidity:    1         Term:   0        Prem:   0        SAB:   0
 TOP:          0       Ectopic:  0        Living: 0
Gestational Age

 U/S Today:     28w 1d                                        EDD:   02/27/21
 Best:          29w 6d     Det. By:  Early Ultrasound         EDD:   02/15/21
Anatomy

 Cranium:               Appears normal         Aortic Arch:            Not well visualized
 Cavum:                 Appears normal         Ductal Arch:            Not well visualized
 Ventricles:            Appears normal         Diaphragm:              Appears normal
 Choroid Plexus:        Appears normal         Stomach:                Appears normal, left
                                                                       sided
 Cerebellum:            Appears normal         Abdomen:                Appears normal
 Posterior Fossa:       Appears normal         Abdominal Wall:         Appears nml (cord
                                                                       insert, abd wall)
 Nuchal Fold:           Not applicable (>20    Cord Vessels:           Appears normal (3
                        wks GA)                                        vessel cord)
 Face:                  Appears normal         Kidneys:                Appear normal
                        (orbits and profile)
 Lips:                  Appears normal         Bladder:                Appears normal
 Thoracic:              Appears normal         Spine:                  Ltd views no
                                                                       intracranial signs of
                                                                       NTD
 Heart:                 Appears normal         Upper Extremities:      Visualized
                        (4CH, axis, and
                        situs)
 RVOT:                  Appears normal         Lower Extremities:      Visualized
 LVOT:                  Appears normal

 Other:  Fetus appears to be a male. Technically difficult due to maternal
         habitus.
Cervix Uterus Adnexa

 Cervix
 Measured translabially. Cervix dilated.

 Uterus
 Multiple fibroids noted.

 Cul De Sac
 No free fluid seen.
Impression

 Limited exam to assess vaginal bleeding
 Cervical dilation is observed with funneling. Amniotic
 membrane is at the os.
 Growth is appropriate for gestational age
 There is normal amniotic fluid.
 The fetus is breech presentation.
 Suboptimal views of the fetal anatomy were observed
 secondary to fetal position.
Recommendations

 Clinical correlation recommended.
